# Patient Record
Sex: Male | Born: 1995 | Race: Black or African American | Hispanic: No | Marital: Single | Smoking: Never smoker
Health system: Southern US, Community
[De-identification: ages and names within clinical notes are randomized; demographics above are authoritative.]

---
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
## 2015-02-10 ENCOUNTER — Encounter (HOSPITAL_COMMUNITY): Payer: Self-pay | Admitting: Emergency Medicine

## 2015-02-10 ENCOUNTER — Emergency Department (HOSPITAL_COMMUNITY)
Admission: EM | Admit: 2015-02-10 | Discharge: 2015-02-10 | Disposition: A | Discharge status: Home / Self Care | Payer: Medicaid Other | Attending: Emergency Medicine | Admitting: Emergency Medicine

## 2015-02-10 DIAGNOSIS — W1839XA Other fall on same level, initial encounter: Secondary | ICD-10-CM | POA: Diagnosis not present

## 2015-02-10 DIAGNOSIS — Z23 Encounter for immunization: Secondary | ICD-10-CM | POA: Insufficient documentation

## 2015-02-10 DIAGNOSIS — S0081XA Abrasion of other part of head, initial encounter: Secondary | ICD-10-CM | POA: Diagnosis not present

## 2015-02-10 DIAGNOSIS — S0993XA Unspecified injury of face, initial encounter: Secondary | ICD-10-CM | POA: Diagnosis present

## 2015-02-10 DIAGNOSIS — Y92410 Unspecified street and highway as the place of occurrence of the external cause: Secondary | ICD-10-CM | POA: Diagnosis not present

## 2015-02-10 DIAGNOSIS — Y9302 Activity, running: Secondary | ICD-10-CM | POA: Insufficient documentation

## 2015-02-10 DIAGNOSIS — Y998 Other external cause status: Secondary | ICD-10-CM | POA: Insufficient documentation

## 2015-02-10 MED ORDER — TETANUS-DIPHTH-ACELL PERTUSSIS 5-2.5-18.5 LF-MCG/0.5 IM SUSP
0.5000 mL | Freq: Once | INTRAMUSCULAR | Status: AC
  Administered 2015-02-10: 0.5 mL via INTRAMUSCULAR
  Filled 2015-02-10: qty 0.5

## 2015-02-10 NOTE — Discharge Instructions (Signed)
A CT scan of your face was recommended and offered to rule out a facial fracture, however you opted out of this exam today. If you develop any worsening of your symptoms, severe pain in your face, difficulty with your vision, or moving your eyes, severe headache, nausea, vomiting, dizziness, weakness please return to the emergency room. Continue to wash your abrasion, use Neosporin as needed. Please follow-up with your primary care doctor. Below is information regarding head injuries and what to look out for.   Abrasion An abrasion is a cut or scrape of the skin. Abrasions do not extend through all layers of the skin and most heal within 10 days. It is important to care for your abrasion properly to prevent infection. CAUSES  Most abrasions are caused by falling on, or gliding across, the ground or other surface. When your skin rubs on something, the outer and inner layer of skin rubs off, causing an abrasion. DIAGNOSIS  Your caregiver will be able to diagnose an abrasion during a physical exam.  TREATMENT  Your treatment depends on how large and deep the abrasion is. Generally, your abrasion will be cleaned with water and a mild soap to remove any dirt or debris. An antibiotic ointment may be put over the abrasion to prevent an infection. A bandage (dressing) may be wrapped around the abrasion to keep it from getting dirty.  You may need a tetanus shot if:  You cannot remember when you had your last tetanus shot.  You have never had a tetanus shot.  The injury broke your skin. If you get a tetanus shot, your arm may swell, get red, and feel warm to the touch. This is common and not a problem. If you need a tetanus shot and you choose not to have one, there is a rare chance of getting tetanus. Sickness from tetanus can be serious.  HOME CARE INSTRUCTIONS   If a dressing was applied, change it at least once a day or as directed by your caregiver. If the bandage sticks, soak it off with warm  water.   Wash the area with water and a mild soap to remove all the ointment 2 times a day. Rinse off the soap and pat the area dry with a clean towel.   Reapply any ointment as directed by your caregiver. This will help prevent infection and keep the bandage from sticking. Use gauze over the wound and under the dressing to help keep the bandage from sticking.   Change your dressing right away if it becomes wet or dirty.   Only take over-the-counter or prescription medicines for pain, discomfort, or fever as directed by your caregiver.   Follow up with your caregiver within 24-48 hours for a wound check, or as directed. If you were not given a wound-check appointment, look closely at your abrasion for redness, swelling, or pus. These are signs of infection. SEEK IMMEDIATE MEDICAL CARE IF:   You have increasing pain in the wound.   You have redness, swelling, or tenderness around the wound.   You have pus coming from the wound.   You have a fever or persistent symptoms for more than 2-3 days.  You have a fever and your symptoms suddenly get worse.  You have a bad smell coming from the wound or dressing.  MAKE SURE YOU:   Understand these instructions.  Will watch your condition.  Will get help right away if you are not doing well or get worse. Document Released: 07/01/2005  Document Revised: 09/07/2012 Document Reviewed: 08/25/2011 [facility] Patient Information 2015 [place] [place]. This information is not intended to replace advice given to you by your health care provider. Make sure you discuss any questions you have with your health care provider.   Head Injury You have received a head injury. It does not appear serious at this time. Headaches and vomiting are common following head injury. It should be easy to awaken from sleeping. Sometimes it is necessary for you to stay in the emergency department for a while for observation. Sometimes admission to the hospital may be  needed. After injuries such as yours, most problems occur within the first 24 hours, but side effects may occur up to 7-10 days after the injury. It is important for you to carefully monitor your condition and contact your health care provider or seek immediate medical care if there is a change in your condition. WHAT ARE THE TYPES OF HEAD INJURIES? Head injuries can be as minor as a bump. Some head injuries can be more severe. More severe head injuries include:  A jarring injury to the brain (concussion).  A bruise of the brain (contusion). This mean there is bleeding in the brain that can cause swelling.  A cracked skull (skull fracture).  Bleeding in the brain that collects, clots, and forms a bump (hematoma). WHAT CAUSES A HEAD INJURY? A serious head injury is most likely to happen to someone who is in a car wreck and is not wearing a seat belt. Other causes of major head injuries include bicycle or motorcycle accidents, sports injuries, and falls. HOW ARE HEAD INJURIES DIAGNOSED? A complete history of the event leading to the injury and your current symptoms will be helpful in diagnosing head injuries. Many times, pictures of the brain, such as CT or MRI are needed to see the extent of the injury. Often, an overnight hospital stay is necessary for observation.  WHEN SHOULD I SEEK IMMEDIATE MEDICAL CARE?  You should get help right away if:  You have confusion or drowsiness.  You feel sick to your stomach (nauseous) or have continued, forceful vomiting.  You have dizziness or unsteadiness that is getting worse.  You have severe, continued headaches not relieved by medicine. Only take over-the-counter or prescription medicines for pain, fever, or discomfort as directed by your health care provider.  You do not have normal function of the arms or legs or are unable to walk.  You notice changes in the black spots in the center of the colored part of your eye (pupil).  You have a clear  or bloody fluid coming from your nose or ears.  You have a loss of vision. During the next 24 hours after the injury, you must stay with someone who can watch you for the warning signs. This person should contact local emergency services (911 in the [place] if you have seizures, you become unconscious, or you are unable to wake up. HOW CAN I PREVENT A HEAD INJURY IN THE FUTURE? The most important factor for preventing major head injuries is avoiding motor vehicle accidents. To minimize the potential for damage to your head, it is crucial to wear seat belts while riding in motor vehicles. Wearing helmets while bike riding and playing collision sports (like football) is also helpful. Also, avoiding dangerous activities around the house will further help reduce your risk of head injury.  WHEN CAN I RETURN TO NORMAL ACTIVITIES AND ATHLETICS? You should be reevaluated by your health care provider before  returning to these activities. If you have any of the following symptoms, you should not return to activities or contact sports until 1 week after the symptoms have stopped:  Persistent headache.  Dizziness or vertigo.  Poor attention and concentration.  Confusion.  Memory problems.  Nausea or vomiting.  Fatigue or tire easily.  Irritability.  Intolerant of bright lights or loud noises.  Anxiety or depression.  Disturbed sleep. MAKE SURE YOU:   Understand these instructions.  Will watch your condition.  Will get help right away if you are not doing well or get worse. Document Released: 09/21/2005 Document Revised: 09/26/2013 Document Reviewed: 05/29/2013 [facility] Patient Information 2015 [place], [place]. This information is not intended to replace advice given to you by your health care provider. Make sure you discuss any questions you have with your health care provider.

## 2015-02-10 NOTE — ED Notes (Signed)
Pt states that he was running across the street to go to a party and fell and hurt his face.  Pt states this happened 2 nights ago.  Has been putting neosporin on his face and injury appears as though it is healing well. Unsure of last tetanus date.

## 2015-02-10 NOTE — ED Provider Notes (Signed)
CSN: [id number]     Arrival date & time 02/10/15  1041 History   First MD Initiated Contact with Patient 02/10/15 1049     Chief Complaint  Patient presents with  . Fall  . Facial Injury     (Consider location/radiation/quality/duration/timing/severity/associated sxs/prior Treatment) HPI Patient is a 19 year old male with no past medical history who presents the ER complaining of abrasion on his face with associated swelling. Patient states 2 nights ago he tripped and fell while running, and landed on the lateral right side of his face. He denies loss of consciousness at that time. Since then patient states he has just noticed swelling and an abrasion. He states he has been using Neosporin on the abrasion. Patient denies severe headache, blurred vision, dizziness, weakness, eye pain, nausea, vomiting, fever, neck pain.  History reviewed. No pertinent past medical history. History reviewed. No pertinent past surgical history. History reviewed. No pertinent family history. History  Substance Use Topics  . Smoking status: Never Smoker   . Smokeless tobacco: Not on file  . Alcohol Use: No    Review of Systems  Constitutional: Negative for fever.  HENT: Positive for facial swelling.        Facial injury  Eyes: Negative for visual disturbance.  Respiratory: Negative for shortness of breath.   Cardiovascular: Negative for chest pain.  Gastrointestinal: Negative for nausea, vomiting and abdominal pain.  Genitourinary: Negative for dysuria.  Skin: Negative for rash.  Neurological: Negative for dizziness, syncope, weakness and numbness.  Psychiatric/Behavioral: Negative.       Allergies  Review of patient's allergies indicates no known allergies.  Home Medications   Prior to Admission medications   Not on File   BP 120/82 mmHg  Pulse 54  Temp(Src) 98.1 F (36.7 C) (Oral)  Resp 16  SpO2 100% Physical Exam  Constitutional: He is oriented to person, place, and time. He  appears well-developed and well-nourished. No distress.  HENT:  Head: Normocephalic and atraumatic.    Right Ear: Tympanic membrane normal.  Left Ear: Tympanic membrane normal.  Nose: Nose normal.  Mouth/Throat: Uvula is midline, oropharynx is clear and moist and mucous membranes are normal. No oral lesions. No trismus in the jaw. No dental abscesses or uvula swelling. No oropharyngeal exudate, posterior oropharyngeal edema, posterior oropharyngeal erythema or tonsillar abscesses.  Eyes: Conjunctivae and EOM are normal. Pupils are equal, round, and reactive to light. Right eye exhibits no discharge. Left eye exhibits no discharge. No scleral icterus.  Mild swelling to upper right eyelid and lateral zygoma region.  Neck: Normal range of motion and full passive range of motion without pain. Neck supple. No spinous process tenderness and no muscular tenderness present. No rigidity. No edema, no erythema and normal range of motion present. No Brudzinski's sign and no Kernig's sign noted.  Cardiovascular: Normal rate.   No murmur heard. Pulmonary/Chest: Effort normal. No respiratory distress.  Musculoskeletal: Normal range of motion. He exhibits no edema or tenderness.  Neurological: He is alert and oriented to person, place, and time. He has normal strength. No cranial nerve deficit or sensory deficit. He displays a negative Romberg sign. Coordination normal. GCS eye subscore is 4. GCS verbal subscore is 5. GCS motor subscore is 6.  Patient fully alert, answering questions appropriately in full, clear sentences. Cranial nerves II through XII grossly intact. Motor strength 5 out of 5 in all major muscle groups of upper and lower extremities. Distal sensation intact.   Skin: Skin is warm and dry.  No rash noted. He is not diaphoretic.  Psychiatric: He has a normal mood and affect.  Nursing note and vitals reviewed.   ED Course  Procedures (including critical care time) Labs Review Labs Reviewed -  No data to display  Imaging Review No results found.   EKG Interpretation None      MDM   Final diagnoses:  Facial abrasion, initial encounter    Patient here with injury to face several nights ago, mild abrasion noted on face. This abrasion does not appear to be infected or complicated. There is mild tenderness surrounding, however there is very low concern for facial fractures. Neurologic exam is benign, no concern for concussion or postconcussive symptoms. EOMs intact, no concern for orbital entrapment or orbital injuries. C-spine cleared with Nexus criteria. I did offer patient CT of face to further evaluate mild swelling and tenderness, and to rule out facial fractures. Patient states he does not wish to undergo this test at this time. I discussed risks and benefits of undergoing the test as well as risks and benefits of refusing it. Patient remained adamant that he does not believe this is warranted based on the symptoms he is currently experiencing. I encouraged washing of abrasion with soap and water daily, continue use of Neosporin, as well as using ice for mild swelling of face. Patient afebrile, hemodynamically stable and in no acute distress. Patient to be discharged at this time. Discussed return precautions with patient, strongly encouraged patient to follow up with his primary care provider, which he states is at [facility]. Patient verbalizes understanding and agreement of this plan.  BP 120/82 mmHg  Pulse 54  Temp(Src) 98.1 F (36.7 C) (Oral)  Resp 16  SpO2 100%  Signed,  Ladona MowJoe Janeece Blok, PA-C 2:54 PM     Ladona MowJoe Alroy Portela, PA-C 02/10/15 1454  Lorre NickAnthony Allen, MD 02/11/15 [phone]

## 2016-04-24 ENCOUNTER — Encounter (INDEPENDENT_AMBULATORY_CARE_PROVIDER_SITE_OTHER): Payer: Self-pay | Admitting: *Deleted

## 2016-04-24 ENCOUNTER — Other Ambulatory Visit (INDEPENDENT_AMBULATORY_CARE_PROVIDER_SITE_OTHER): Payer: Self-pay | Admitting: *Deleted

## 2016-04-24 DIAGNOSIS — K921 Melena: Secondary | ICD-10-CM

## 2018-08-22 ENCOUNTER — Telehealth: Payer: Self-pay | Admitting: Family Medicine

## 2018-08-22 NOTE — Telephone Encounter (Signed)
LVM for pt to call the office and reschedule. Appt was originally scheduled for 12/3. ° °I have moved the appt to 12/6 at the same time as the original appt. If this does not work for the patient, please reschedule at their convenience.  ° °Thank you!  °

## 2018-08-22 NOTE — Telephone Encounter (Signed)
° ° ° °  Pt said the appt for 09/09/18 is ok

## 2018-09-06 ENCOUNTER — Ambulatory Visit: Payer: Self-pay | Admitting: Family Medicine

## 2018-09-09 ENCOUNTER — Other Ambulatory Visit: Payer: Self-pay

## 2018-09-09 ENCOUNTER — Ambulatory Visit (INDEPENDENT_AMBULATORY_CARE_PROVIDER_SITE_OTHER): Payer: Self-pay | Admitting: Family Medicine

## 2018-09-09 ENCOUNTER — Encounter: Payer: Self-pay | Admitting: Family Medicine

## 2018-09-09 VITALS — BP 113/76 | HR 73 | Temp 98.5°F | Resp 16 | Ht 63.0 in | Wt 100.0 lb

## 2018-09-09 DIAGNOSIS — F640 Transsexualism: Secondary | ICD-10-CM

## 2018-09-09 DIAGNOSIS — Z79899 Other long term (current) drug therapy: Secondary | ICD-10-CM

## 2018-09-09 DIAGNOSIS — Z789 Other specified health status: Secondary | ICD-10-CM

## 2018-09-09 MED ORDER — ESTRADIOL 2 MG PO TABS: 2.0000 mg | ORAL_TABLET | Freq: Three times a day (TID) | ORAL | 0 refills | Status: DC

## 2018-09-09 MED ORDER — ESTRADIOL 2 MG PO TABS: 2.0000 mg | ORAL_TABLET | Freq: Three times a day (TID) | ORAL | 2 refills | Status: AC

## 2018-09-09 MED ORDER — SPIRONOLACTONE 100 MG PO TABS: 100.0000 mg | ORAL_TABLET | Freq: Two times a day (BID) | ORAL | 2 refills | Status: AC

## 2018-09-09 MED ORDER — SPIRONOLACTONE 100 MG PO TABS: 100.0000 mg | ORAL_TABLET | Freq: Two times a day (BID) | ORAL | 0 refills | Status: DC

## 2018-09-09 MED ORDER — SPIRONOLACTONE 100 MG PO TABS: 100.0000 mg | ORAL_TABLET | Freq: Two times a day (BID) | ORAL | 2 refills | Status: DC

## 2018-09-09 MED ORDER — ESTRADIOL 2 MG PO TABS: 2.0000 mg | ORAL_TABLET | Freq: Three times a day (TID) | ORAL | 2 refills | Status: DC

## 2018-09-09 NOTE — Patient Instructions (Signed)
° ° ° °  If you have lab work done today you will be contacted with your lab results within the next 2 weeks.  If you have not heard from us then please contact us. The fastest way to get your results is to register for My Chart. ° ° °IF you received an x-ray today, you will receive an invoice from [facility]. Please contact [facility] at [phone] with questions or concerns regarding your invoice.  ° °IF you received labwork today, you will receive an invoice from [facility]. Please contact [facility] at [phone] with questions or concerns regarding your invoice.  ° °Our billing staff will not be able to assist you with questions regarding bills from these companies. ° °You will be contacted with the lab results as soon as they are available. The fastest way to get your results is to activate your My Chart account. Instructions are located on the last page of this paperwork. If you have not heard from us regarding the results in 2 weeks, please contact this office. °  ° ° ° °

## 2018-09-09 NOTE — Progress Notes (Signed)
Subjective:    Patient: Noah Garcia  DOB: 1996-06-01; 22 y.o.   MRN: [id number]  Chief Complaint  Patient presents with  . New pt  . Referral    from Dr. Rosario Jacks, Endocrinologist  . Medication Refill    pt request that she would like to go back taking Spironolactone 100mg , "works fine" and it's cheaper than 50mg  twice a day    HPI Started on hormone therapy with Dr. Ruby Cola 09/2015. Takes 1 at 9 am, the other around 2 and then at 9 pm and then takes the spiro at 9 and 9.    Graduated with degree in psychology - thinking about going back to grad school to become therapyst. 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  Off [employer]-Sat and then comes back in at [place] night.  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   PGM passed from breast cancer in late 50s Both sisters of mom passed away from breast cancer.   Going to get back into [employer] and jazz and modern.  Mainly sexually attracted to male but open.   Interested in bottom surgery.  Medical History No past medical history on file. No past surgical history on file. Current Outpatient Medications on File Prior to Visit  Medication Sig Dispense Refill  . estradiol (ESTRACE) 2 MG tablet Take 2 mg by mouth daily.    Marland Kitchen spironolactone (ALDACTONE) 50 MG tablet Take 50 mg by mouth daily.     No current facility-administered medications on file prior to visit.    No Known Allergies Family History  Family history unknown: Yes   Social History   Socioeconomic History  . Marital status: Single    Spouse name: Not on file  . Number of children: Not on file  . Years of education: Not on file  . Highest education level: Not on file  Occupational History  . Not on file  Social Needs  . Financial resource strain: Not on file  . Food insecurity:    Worry: Not on file    Inability: Not on file  . Transportation needs:    Medical: Not on file    Non-medical: Not on file  Tobacco Use  .  Smoking status: Never Smoker  . Smokeless tobacco: Never Used  Substance and Sexual Activity  . Alcohol use: Yes    Alcohol/week: 2.0 standard drinks    Types: 2 Standard drinks or equivalent per week  . Drug use: No  . Sexual activity: Not on file  Lifestyle  . Physical activity:    Days per week: Not on file    Minutes per session: Not on file  . Stress: Not on file  Relationships  . Social connections:    Talks on phone: Not on file    Gets together: Not on file    Attends religious service: Not on file    Active member of club or organization: Not on file    Attends meetings of clubs or organizations: Not on file    Relationship status: Not on file  Other Topics Concern  . Not on file  Social History Narrative  . Not on file   Depression screen [facility] 2/9 09/09/2018  Decreased Interest 0  Down, Depressed, Hopeless 0  PHQ - 2 Score 0    ROS As noted in HPI  Objective:  BP 113/76 (BP Location: Right Arm, Patient Position: Sitting, Cuff Size: Normal)   Pulse 73   Temp  98.5 F (36.9 C) (Oral)   Resp 16   Ht 5\' 3"  (1.6 m)   Wt 100 lb (45.4 kg)   SpO2 98%   BMI 17.71 kg/m  Physical Exam Constitutional:      General: He is not in acute distress.    Appearance: He is well-developed. He is not diaphoretic.  HENT:     Head: Normocephalic and atraumatic.  Eyes:     General: No scleral icterus.    Conjunctiva/sclera: Conjunctivae normal.     Pupils: Pupils are equal, round, and reactive to light.  Neck:     Musculoskeletal: Normal range of motion and neck supple.     Thyroid: No thyromegaly.  Cardiovascular:     Rate and Rhythm: Normal rate and regular rhythm.     Heart sounds: Normal heart sounds.  Pulmonary:     Effort: Pulmonary effort is normal. No respiratory distress.     Breath sounds: Normal breath sounds.  Lymphadenopathy:     Cervical: No cervical adenopathy.  Skin:    General: Skin is warm and dry.  Neurological:     Mental Status: He is alert and  oriented to person, place, and time.  Psychiatric:        Behavior: Behavior normal.     POC TESTING No visits with results within 3 Day(s) from this visit.  Latest known visit with results is:  Orders Only on 10/29/2008  Component Date Value Ref Range Status  . Neutrophils Relative % 10/29/2008 84* 33 - 67 % Final  . Neutro Abs 10/29/2008 7.0  1.5 - 8.0 K/uL Final  . Lymphocytes Relative 10/29/2008 9* 31 - 63 % Final  . Lymphs Abs 10/29/2008 0.8* 1.5 - 7.5 K/uL Final  . Monocytes Relative 10/29/2008 5  3 - 11 % Final  . Monocytes Absolute 10/29/2008 0.4  0.2 - 1.2 K/uL Final  . Eosinophils Relative 10/29/2008 2  0 - 5 % Final  . Eosinophils Absolute 10/29/2008 0.1  0.0 - 1.2 K/uL Final  . Basophils Relative 10/29/2008 1  0 - 1 % Final  . Basophils Absolute 10/29/2008 0.0  0.0 - 0.1 K/uL Final     Assessment & Plan:   1. Encounter for long-term current use of high risk medication    Patient will continue on current chronic medications other than changes noted above, so ok to refill when needed.   See after visit summary for patient specific instructions.  Orders Placed This Encounter  Procedures  . Comprehensive metabolic panel    Standing Status:   Future    Standing Expiration Date:   09/10/2019  . CBC with Differential/Platelet    Standing Status:   Future    Standing Expiration Date:   09/10/2019  . TestT+TestF+SHBG    Standing Status:   Future    Standing Expiration Date:   09/10/2019  . Estradiol    Standing Status:   Future    Standing Expiration Date:   09/10/2019    Meds ordered this encounter  Medications  . DISCONTD: spironolactone (ALDACTONE) 100 MG tablet    Sig: Take 1 tablet (100 mg total) by mouth 2 (two) times daily.    Dispense:  180 tablet    Refill:  0  . DISCONTD: estradiol (ESTRACE) 2 MG tablet    Sig: Take 1 tablet (2 mg total) by mouth 3 (three) times daily.    Dispense:  270 tablet    Refill:  0  . DISCONTD: estradiol (ESTRACE) 2  MG tablet     Sig: Take 1 tablet (2 mg total) by mouth 3 (three) times daily.    Dispense:  90 tablet    Refill:  2    Pt prefers 30d with refills - please d/c prior 90d rx  . DISCONTD: spironolactone (ALDACTONE) 100 MG tablet    Sig: Take 1 tablet (100 mg total) by mouth 2 (two) times daily.    Dispense:  60 tablet    Refill:  2    Pt prefers 30d with refills - please d/c prior 90d rx  . estradiol (ESTRACE) 2 MG tablet    Sig: Take 1 tablet (2 mg total) by mouth 3 (three) times daily.    Dispense:  90 tablet    Refill:  2  . spironolactone (ALDACTONE) 100 MG tablet    Sig: Take 1 tablet (100 mg total) by mouth 2 (two) times daily.    Dispense:  60 tablet    Refill:  2    Patient verbalized to me that they understand the following: diagnosis, what is being done for them, what to expect and what should be done at home.  Their questions have been answered. They understand that I am unable to predict every possible medication interaction or adverse outcome and that if any unexpected symptoms arise, they should contact us and their pharmacist, as well as never hesitate to [facility] or ER if they think it might be warranted.    Norberto SorensonEva , MD, MPH [facility]  [facility] [address] [place], [place]  [id number] [phone] Office phone  [phone] Office fax  09/09/18 4:30 PM

## 2018-10-03 DIAGNOSIS — Z789 Other specified health status: Secondary | ICD-10-CM | POA: Insufficient documentation

## 2018-10-03 DIAGNOSIS — F640 Transsexualism: Secondary | ICD-10-CM | POA: Insufficient documentation

## 2018-12-14 ENCOUNTER — Telehealth: Payer: Self-pay | Admitting: Family Medicine

## 2018-12-14 NOTE — Telephone Encounter (Signed)
Copied from CRM [id number]. Topic: Medical Record Request - Patient ROI Request >> Dec 14, 2018  2:30 PM Maia Petties wrote: Requestor Name/Agency: self Call Back #: [phone] Information Requested: pt is wanting to come into the office to pick up copies of OV and labs from Dr. Clelia Croft. Pt has only seen Dr. Clelia Croft once but said they had previous medical records transferred to Dr. Clelia Croft too. Pt needs copies of those records as well and will come into the office Thursday 12/22/2018 to pick up and complete MR request form.   Can someone check for these records to have prepared for the pt? Please call pt.   Route to [employer] for [employer]. For all other clinics, route to the clinic's PEC Pool.

## 2018-12-15 NOTE — Telephone Encounter (Signed)
I called pt and left a VM. I printed off his ov notes from 09/09/18, their labs were active-so no results, they have not had labs done yet. I informed pt that I not able to give another practice's medical info concerning pt to them.

## 2019-04-12 ENCOUNTER — Encounter (HOSPITAL_COMMUNITY): Payer: Self-pay | Admitting: *Deleted

## 2019-04-12 ENCOUNTER — Emergency Department (HOSPITAL_COMMUNITY): Payer: Self-pay

## 2019-04-12 ENCOUNTER — Other Ambulatory Visit: Payer: Self-pay

## 2019-04-12 ENCOUNTER — Emergency Department (HOSPITAL_COMMUNITY)
Admission: EM | Admit: 2019-04-12 | Discharge: 2019-04-12 | Disposition: A | Discharge status: Home / Self Care | Payer: Self-pay | Attending: Emergency Medicine | Admitting: Emergency Medicine

## 2019-04-12 DIAGNOSIS — Z79899 Other long term (current) drug therapy: Secondary | ICD-10-CM | POA: Insufficient documentation

## 2019-04-12 DIAGNOSIS — R1084 Generalized abdominal pain: Secondary | ICD-10-CM | POA: Insufficient documentation

## 2019-04-12 DIAGNOSIS — G8929 Other chronic pain: Secondary | ICD-10-CM | POA: Insufficient documentation

## 2019-04-12 DIAGNOSIS — M546 Pain in thoracic spine: Secondary | ICD-10-CM | POA: Insufficient documentation

## 2019-04-12 LAB — CBC
HCT: 48.6 % (ref 39.0–52.0)
Hemoglobin: 17.2 g/dL — ABNORMAL HIGH (ref 13.0–17.0)
MCH: 26 pg (ref 26.0–34.0)
MCHC: 35.4 g/dL (ref 30.0–36.0)
MCV: 73.5 fL — ABNORMAL LOW (ref 80.0–100.0)
Platelets: 351 10*3/uL (ref 150–400)
RBC: 6.61 MIL/uL — ABNORMAL HIGH (ref 4.22–5.81)
RDW: 12.7 % (ref 11.5–15.5)
WBC: 5 10*3/uL (ref 4.0–10.5)
nRBC: 0 % (ref 0.0–0.2)

## 2019-04-12 LAB — URINALYSIS, ROUTINE W REFLEX MICROSCOPIC
Bilirubin Urine: NEGATIVE
Glucose, UA: NEGATIVE mg/dL
Hgb urine dipstick: NEGATIVE
Ketones, ur: 20 mg/dL — AB
Leukocytes,Ua: NEGATIVE
Nitrite: NEGATIVE
Protein, ur: NEGATIVE mg/dL
Specific Gravity, Urine: 1.025 (ref 1.005–1.030)
pH: 6 (ref 5.0–8.0)

## 2019-04-12 LAB — COMPREHENSIVE METABOLIC PANEL
ALT: 14 U/L (ref 0–44)
AST: 17 U/L (ref 15–41)
Albumin: 4.7 g/dL (ref 3.5–5.0)
Alkaline Phosphatase: 59 U/L (ref 38–126)
Anion gap: 9 (ref 5–15)
BUN: 17 mg/dL (ref 6–20)
CO2: 24 mmol/L (ref 22–32)
Calcium: 9.6 mg/dL (ref 8.9–10.3)
Chloride: 101 mmol/L (ref 98–111)
Creatinine, Ser: 1.01 mg/dL (ref 0.61–1.24)
GFR calc Af Amer: >60 mL/min (ref >60)
GFR calc non Af Amer: >60 mL/min (ref >60)
Glucose, Bld: 92 mg/dL (ref 70–99)
Potassium: 3.9 mmol/L (ref 3.5–5.1)
Sodium: 134 mmol/L — ABNORMAL LOW (ref 135–145)
Total Bilirubin: 1.1 mg/dL (ref 0.3–1.2)
Total Protein: 8.8 g/dL — ABNORMAL HIGH (ref 6.5–8.1)

## 2019-04-12 LAB — LIPASE, BLOOD: Lipase: 24 U/L (ref 11–51)

## 2019-04-12 LAB — PREGNANCY, URINE: Preg Test, Ur: NEGATIVE

## 2019-04-12 MED ORDER — SODIUM CHLORIDE 0.9% FLUSH: 3.0000 mL | Freq: Once | INTRAVENOUS | Status: DC

## 2019-04-12 MED ORDER — LIDOCAINE 5 % EX PTCH: 1.0000 | MEDICATED_PATCH | CUTANEOUS | 0 refills | Status: DC

## 2019-04-12 NOTE — ED Notes (Signed)
Urine culture sent to lab with UA sample 

## 2019-04-12 NOTE — ED Provider Notes (Signed)
[facility] DEPT Provider Note   CSN: [id number] Arrival date & time: 04/12/19  0849  History   Chief Complaint Chief Complaint  Patient presents with   Abdominal Pain   Back Pain   HPI Noah Garcia is a 23 y.o. male to male transgender who presents for evaluation of 2 complaints.  Patient states they have had midline thoracic back pain times years.  Patient states this is intermittent weekly occurs after she works long hours.  Patient states she has increased her hours due to going back after her COVID restrictions were up this is when her pain began.  Patient states she took Tylenol yesterday which resolved her pain.  She has no current back pain.  She denies any injuries or trauma.  Denies fever, chills, abdominal pain radiating into back, IV drug use, bowel or bladder incontinence, saddle paresthesias, decreased range of motion, numbness or tingling in the extremities.  She states pain feels similar to previous back pain.  Patient states that she has also had diffuse abdominal cramping over the last 3 days.  Patient states she normally has 3 bowel movements a day however over the last 2 days she did not have a bowel movement.  Patient states she was also able to have a bowel movement this morning that melena or hematochezia which resolved majority of her symptoms.  She has no current abdominal pain.  Denies cough, nausea, vomiting, hematemesis, dysuria, diarrhea, suspicious food intake.  Has not taken anything for symptoms.  She is tolerating p.o. intake at home without difficulty.  History obtained from patient and past medical records.  No interpreter is used.     HPI  History reviewed. No pertinent past medical history.  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 No pertinent surgical history.      Home Medications    Prior to Admission medications   Medication  Sig Start Date End Date Taking? Authorizing Provider  estradiol (ESTRACE) 2 MG tablet Take 1 tablet (2 mg total) by mouth 3 (three) times daily. 09/09/18  Yes Shawnee Knapp, MD  spironolactone (ALDACTONE) 100 MG tablet Take 1 tablet (100 mg total) by mouth 2 (two) times daily. 09/09/18  Yes Shawnee Knapp, MD  lidocaine (LIDODERM) 5 % Place 1 patch onto the skin daily. Remove & Discard patch within 12 hours or as directed by MD 04/12/19   Toran Murch A, PA-C    Family History Family History  Family history unknown: Yes    Social History Social History   Tobacco Use   Smoking status: Never Smoker   Smokeless tobacco: Never Used  Substance Use Topics   Alcohol use: Yes    Alcohol/week: 2.0 standard drinks    Types: 2 Standard drinks or equivalent per week   Drug use: No     Allergies   Patient has no known allergies.   Review of Systems Review of Systems  Constitutional: Negative.   HENT: Negative.   Eyes: Negative.   Respiratory: Negative.   Cardiovascular: Negative.   Gastrointestinal: Positive for abdominal pain. Negative for abdominal distention, anal bleeding, blood in stool, constipation, diarrhea, nausea, rectal pain and vomiting.  Genitourinary: Negative.   Musculoskeletal: Positive for back pain. Negative for arthralgias, gait problem, joint swelling, myalgias, neck pain and neck stiffness.  Skin: Negative.   Neurological: Negative.   All other systems reviewed and are negative.  Physical Exam  Updated Vital Signs BP (!) 123/95 (BP Location: Right Arm)    Pulse (!) 104    Temp 99 F (37.2 C) (Oral)    Resp 18    SpO2 96%   Physical Exam Vitals signs and nursing note reviewed.  Constitutional:      General: He is not in acute distress.    Appearance: He is not ill-appearing, toxic-appearing or diaphoretic.  HENT:     Head: Normocephalic and atraumatic.     Jaw: There is normal jaw occlusion.     Nose:     Comments: Clear rhinorrhea and congestion to  bilateral nares.  No sinus tenderness.    Mouth/Throat:     Mouth: Mucous membranes are moist.     Pharynx: Oropharynx is clear.     Comments: Posterior oropharynx clear.  Mucous membranes moist.  Tonsils without erythema or exudate.  Uvula midline without deviation.  No evidence of PTA or RPA.  No drooling, dysphasia or trismus.  Phonation normal. Neck:     Musculoskeletal: Full passive range of motion without pain and normal range of motion.     Trachea: Trachea and phonation normal.     Comments: No Neck stiffness or neck rigidity.  No cervical lymphadenopathy. Cardiovascular:     Rate and Rhythm: Normal rate.     Heart sounds: Normal heart sounds.     Comments: No murmurs rubs or gallops. Pulmonary:     Effort: Pulmonary effort is normal.     Comments: Clear to auscultation bilaterally without wheeze, rhonchi or rales.  No accessory muscle usage.  Able speak in full sentences. Abdominal:     General: Bowel sounds are normal.     Palpations: Abdomen is soft.     Tenderness: There is no abdominal tenderness. There is no left CVA tenderness, guarding or rebound. Negative signs include Murphy's sign.     Hernia: No hernia is present.     Comments: Soft, nontender without rebound or guarding.  No CVA tenderness.  Musculoskeletal:     Cervical back: Normal.     Thoracic back: He exhibits tenderness. He exhibits normal range of motion, no bony tenderness, no swelling, no edema, no deformity, no laceration, no pain, no spasm and normal pulse.     Lumbar back: Normal.     Comments: Moves all 4 extremities without difficulty.  Lower extremities without edema, erythema or warmth. Full range of motion of the T-spine and L-spine with flexion, hyperextension, and lateral flexion. No midline tenderness or stepoffs. No tenderness to palpation of the spinous processes of the T-spine or L-spine.  Tenderness palpation to bilateral paraspinal muscles to thoracic spine.  Able to reproduce her pain.  No  overlying skin changes. No tenderness to palpation of the paraspinous muscles of the L-spine. Negative straight leg raise.  Skin:    Comments: Brisk capillary refill.  No rashes or lesions.  Neurological:     Mental Status: He is alert.     Comments: Speech is clear and goal oriented, follows commands Normal 5/5 strength in upper and lower extremities bilaterally including dorsiflexion and plantar flexion, strong and equal grip strength Sensation normal to light and sharp touch Moves extremities without ataxia, coordination intact Normal gait Normal balance No Clonus     ED Treatments / Results  Labs (all labs ordered are listed, but only abnormal results are displayed) Labs Reviewed  COMPREHENSIVE METABOLIC PANEL - Abnormal; Notable for the following components:      Result Value  Sodium 134 (*)    Total Protein 8.8 (*)    All other components within normal limits  CBC - Abnormal; Notable for the following components:   RBC 6.61 (*)    Hemoglobin 17.2 (*)    MCV 73.5 (*)    All other components within normal limits  URINALYSIS, ROUTINE W REFLEX MICROSCOPIC - Abnormal; Notable for the following components:   APPearance HAZY (*)    Ketones, ur 20 (*)    All other components within normal limits  LIPASE, BLOOD  PREGNANCY, URINE    EKG None  Radiology Dg Thoracic Spine 2 View  Result Date: 04/12/2019 CLINICAL DATA:  Upper back pain. EXAM: THORACIC SPINE 2 VIEWS COMPARISON:  None. FINDINGS: There is no evidence of thoracic spine fracture. Alignment is normal. No other significant bone abnormalities are identified. IMPRESSION: Negative. Electronically Signed   By: Lupita RaiderJames  Green Jr M.D.   On: 04/12/2019 11:57   Dg Abdomen 1 View  Result Date: 04/12/2019 CLINICAL DATA:  Epigastric abdominal pain. EXAM: ABDOMEN - 1 VIEW COMPARISON:  None. FINDINGS: The bowel gas pattern is normal. No radio-opaque calculi or other significant radiographic abnormality are seen. IMPRESSION: No  evidence of bowel obstruction or ileus. Electronically Signed   By: Lupita RaiderJames  Green Jr M.D.   On: 04/12/2019 11:58    Procedures Procedures (including critical care time)  Medications Ordered in ED Medications  sodium chloride flush (NS) 0.9 % injection 3 mL (has no administration in time range)     Initial Impression / Assessment and Plan / ED Course  I have reviewed the triage vital signs and the nursing notes.  Pertinent labs & imaging results that were available during my care of the patient were reviewed by me and considered in my medical decision making (see chart for details).   [age] male to male transgender who appears otherwise well presents for evaluation of back pain and abdominal pain.  Febrile, nonseptic, non-ill-appearing.  History of chronic thoracic back pain worse when stands for long periods of time.  Has recently had extended periods of standing at work.  Took Tylenol yesterday which resolved pain.  Has no current pain on evaluation.  Normal musculoskeletal exam.  Neurovascularly intact.  Able to reproduce pain to palpation to paraspinal muscles to thoracic spine.  No midline spinal step-offs or crepitus.  No new injuries or trauma.  No red flags for back pain.  Likely acute on chronic pain.  Without tachypnea or hypoxia.  No cough, hemoptysis, shortness of breath to suggest atypical PE, dissection or ACS.  Discussed taking Tylenol and ibuprofen for symptoms.  Also had diffuse abdominal pain which she described as cramping this morning.  Had constipation over the last 2 days had a bowel movement this morning without melena or hematochezia which resolved the pain.  Tolerating p.o. intake without difficulty.  Abdomen soft, nontender without rebound or guarding.  Nonsurgical abdomen.  Labs obtained from triage.  Labs and imaging personally reviewed: CBC without leukocytosis Metabolic panel with mild hyponatremia at 134, additional electrolyte, renal or liver abnormality Lipase  24 Urinalysis negative for infection Plain film abdomen without evidence of constipation, ileus Plain film thoracic without acute findings.  Patient is nontoxic, nonseptic appearing, in no apparent distress.   Labs, imaging and vitals reviewed.  Patient does not meet the SIRS or Sepsis criteria.  On repeat exam patient does not have a surgical abdomin and there are no peritoneal signs.  No indication of appendicitis, bowel obstruction, bowel perforation,  cholecystitis, diverticulitis.  Patient discharged home with symptomatic treatment and given strict instructions for follow-up with their primary care physician. No neurological deficits and normal neuro exam.  Patient can walk but states is painful.  No loss of bowel or bladder control.  No concern for cauda equina, discitis, osteomyelitis, transverse myelitis, bowel perforation, abscess, PE or dissection. no fever, night sweats, weight loss, h/o cancer, IVDU.  RICE protocol and pain medicine indicated and discussed with patient. Has not had pain her entire ED stay.  Tolerating p.o. intake and ambulating without difficulty.  The patient has been appropriately medically screened and/or stabilized in the ED. I have low suspicion for any other emergent medical condition which would require further screening, evaluation or treatment in the ED or require inpatient management.  Patient is hemodynamically stable and in no acute distress.  Patient able to ambulate in department prior to ED.  Evaluation does not show acute pathology that would require ongoing or additional emergent interventions while in the emergency department or further inpatient treatment.  I have discussed the diagnosis with the patient and answered all questions.  Patient has no further complaints prior to discharge.  Patient is comfortable with plan discussed in room and is stable for discharge at this time.  I have discussed strict return precautions for returning to the emergency  department.  Patient was encouraged to follow-up with PCP/specialist refer to at discharge.      Final Clinical Impressions(s) / ED Diagnoses   Final diagnoses:  Chronic midline thoracic back pain  Generalized abdominal pain    ED Discharge Orders         Ordered    lidocaine (LIDODERM) 5 %  Every 24 hours     04/12/19 1238           Madia Carvell A, PA-C 04/12/19 1245    Mancel BaleWentz, Elliott, MD 04/12/19 1807

## 2019-04-12 NOTE — ED Triage Notes (Signed)
Pt complains of upper abdominal pain x 2 days and mid back pain. Pt states her mid back pain feels similar to what she's had in the past. Pt states she did not have bowel until this morning, which she states was small. Pt denies urinary symptoms. Pt states symptoms were worse last night.

## 2019-04-12 NOTE — Discharge Instructions (Signed)
Follow-up with PCP for evaluation of her chronic back pain.  Use lidocaine patches may place for 12 hours, remove for 12 hours and taken place additional 1.

## 2020-05-20 IMAGING — CR ABDOMEN - 1 VIEW
1 series · 1 of 1 positions shown · non-contrast
Comparison: None.

CLINICAL DATA: Epigastric abdominal pain.

EXAM:
ABDOMEN - 1 VIEW

[t abdomen supine]
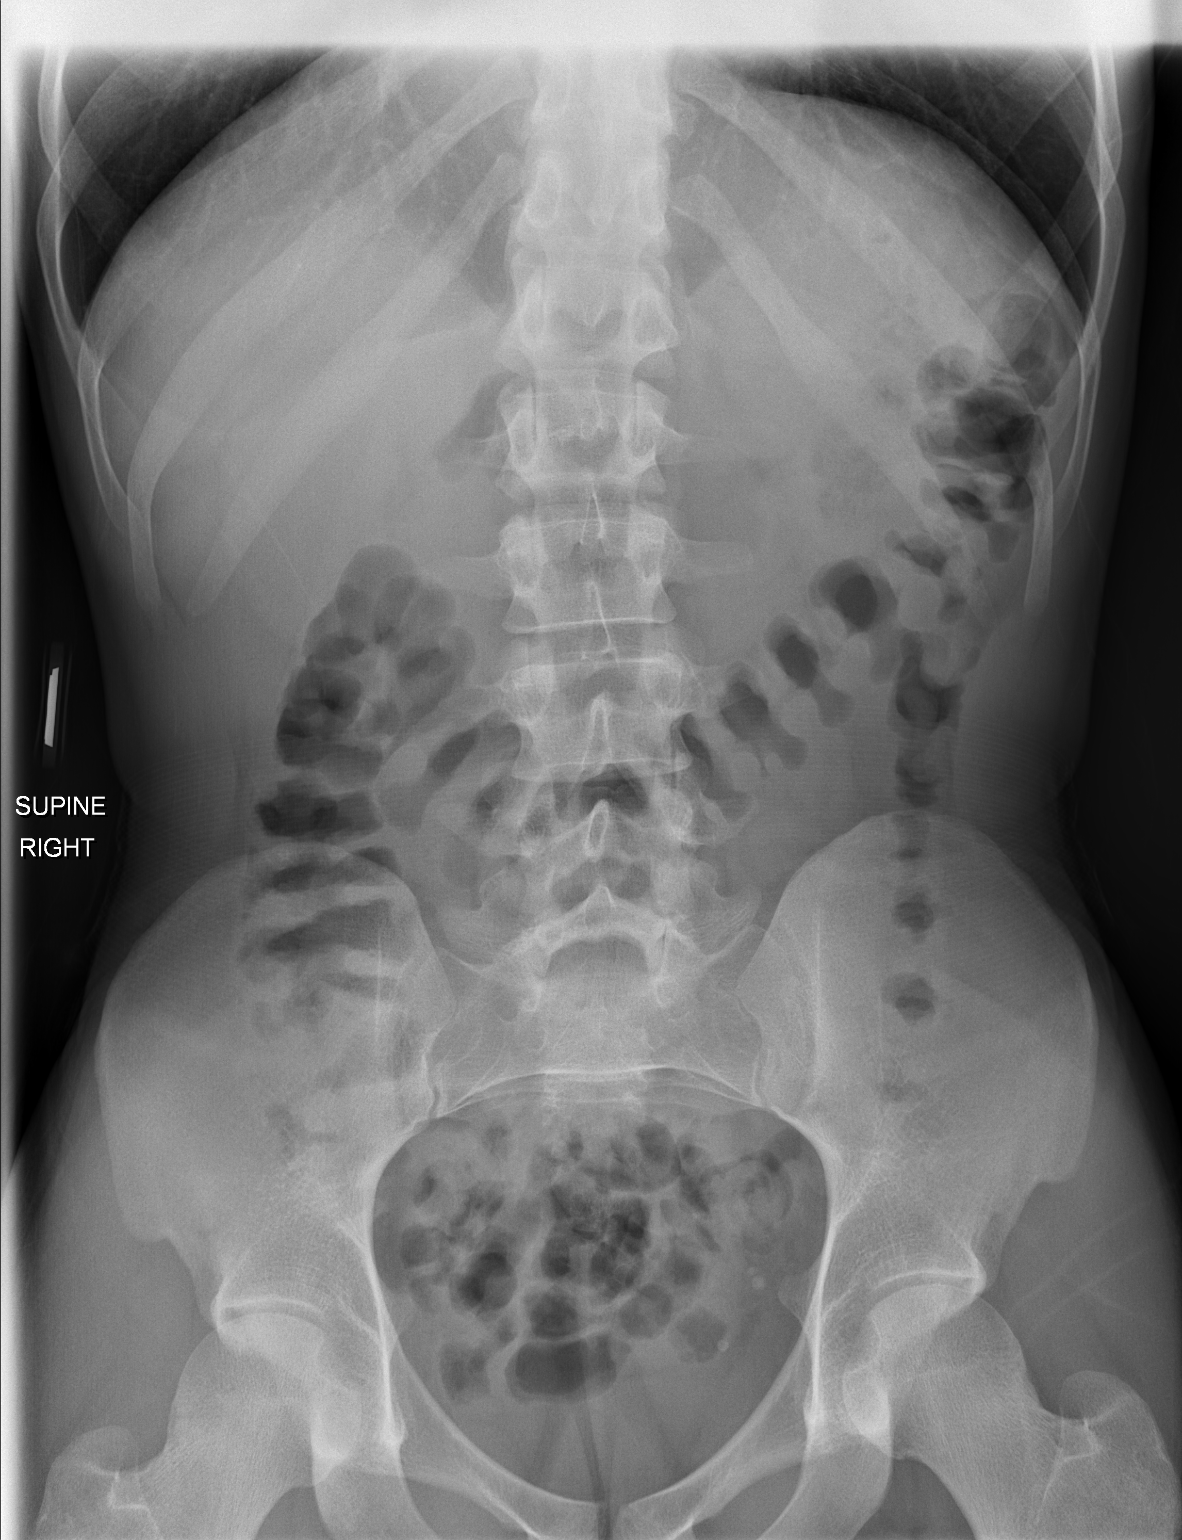

[1 of 1 positions shown; findings below may reference images not displayed]

FINDINGS: The bowel gas pattern is normal. No radio-opaque calculi or other
significant radiographic abnormality are seen.
IMPRESSION: No evidence of bowel obstruction or ileus.

## 2020-05-20 IMAGING — CR THORACIC SPINE 2 VIEWS
3 series · 3 of 3 positions shown · non-contrast
Comparison: None.

CLINICAL DATA: Upper back pain.

EXAM:
THORACIC SPINE 2 VIEWS

[t thoracic spine ap]
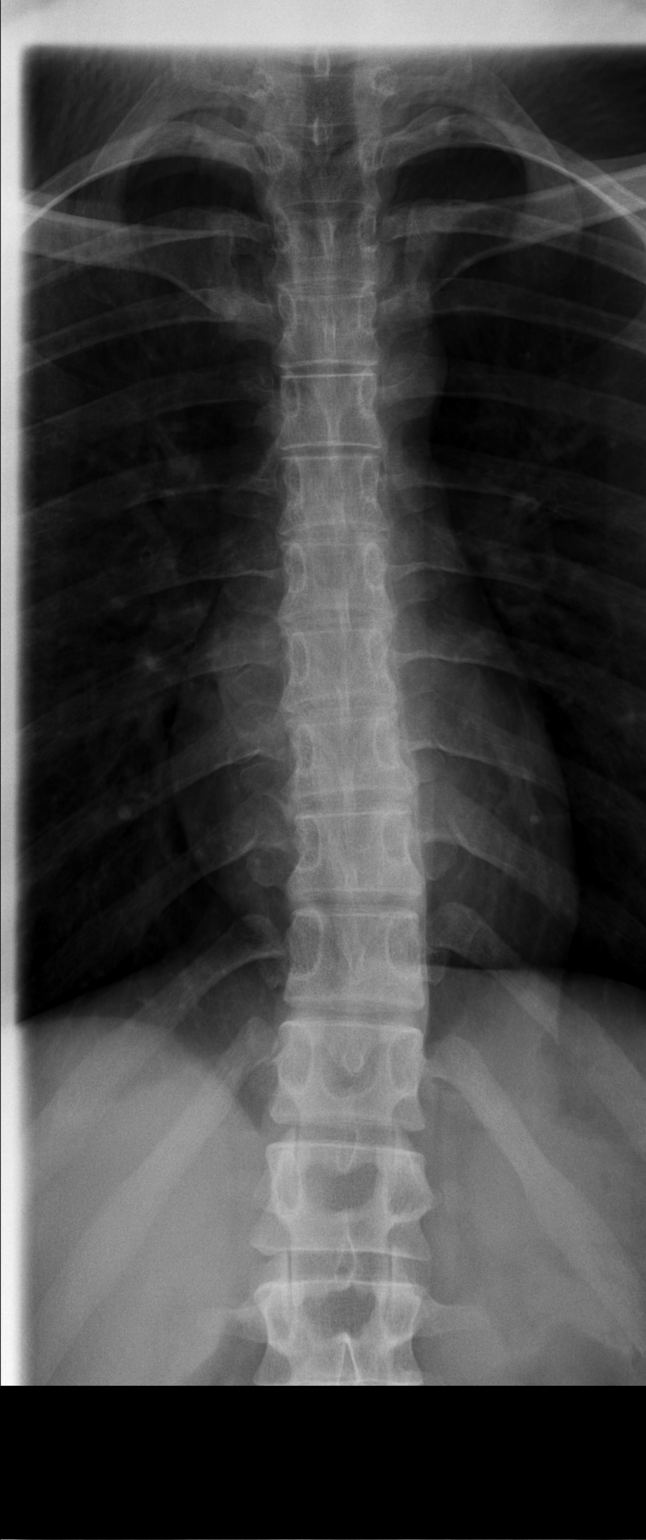

[t thoracic spine lat]
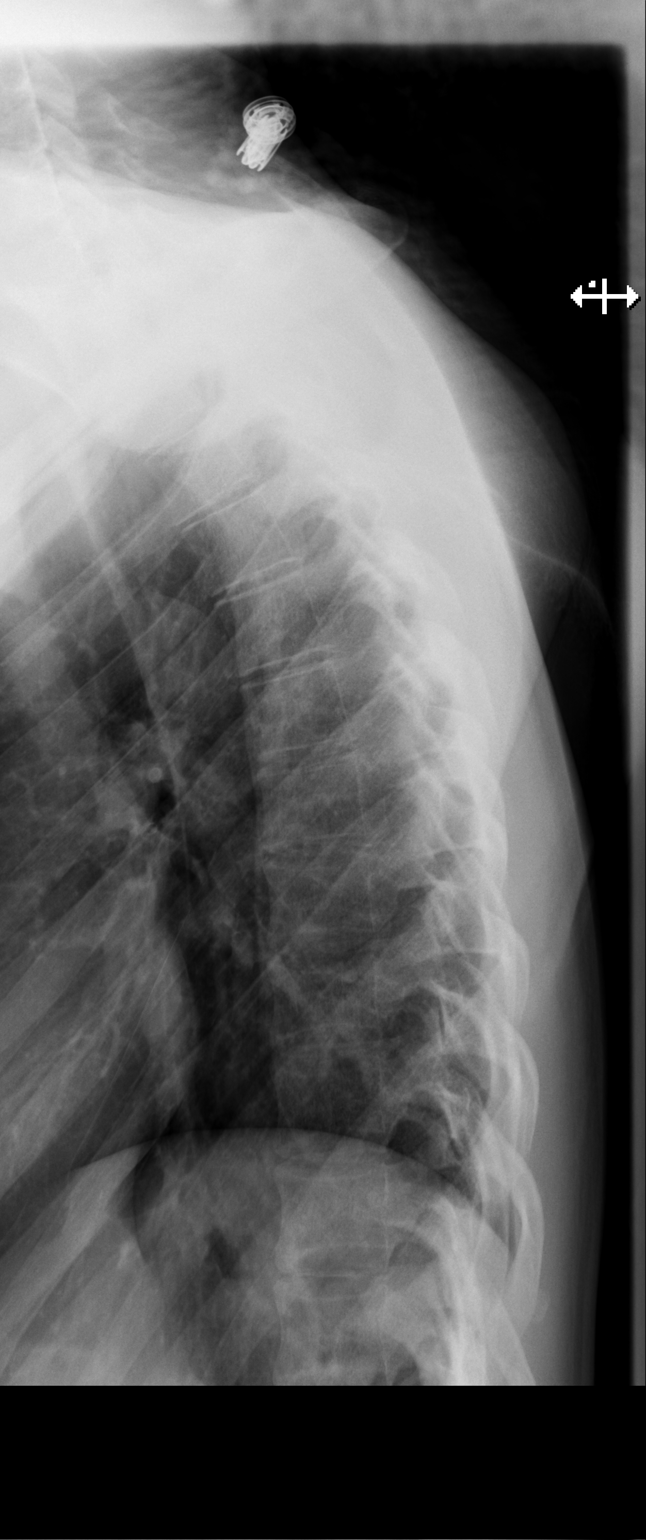

[t thoracic swimmers]
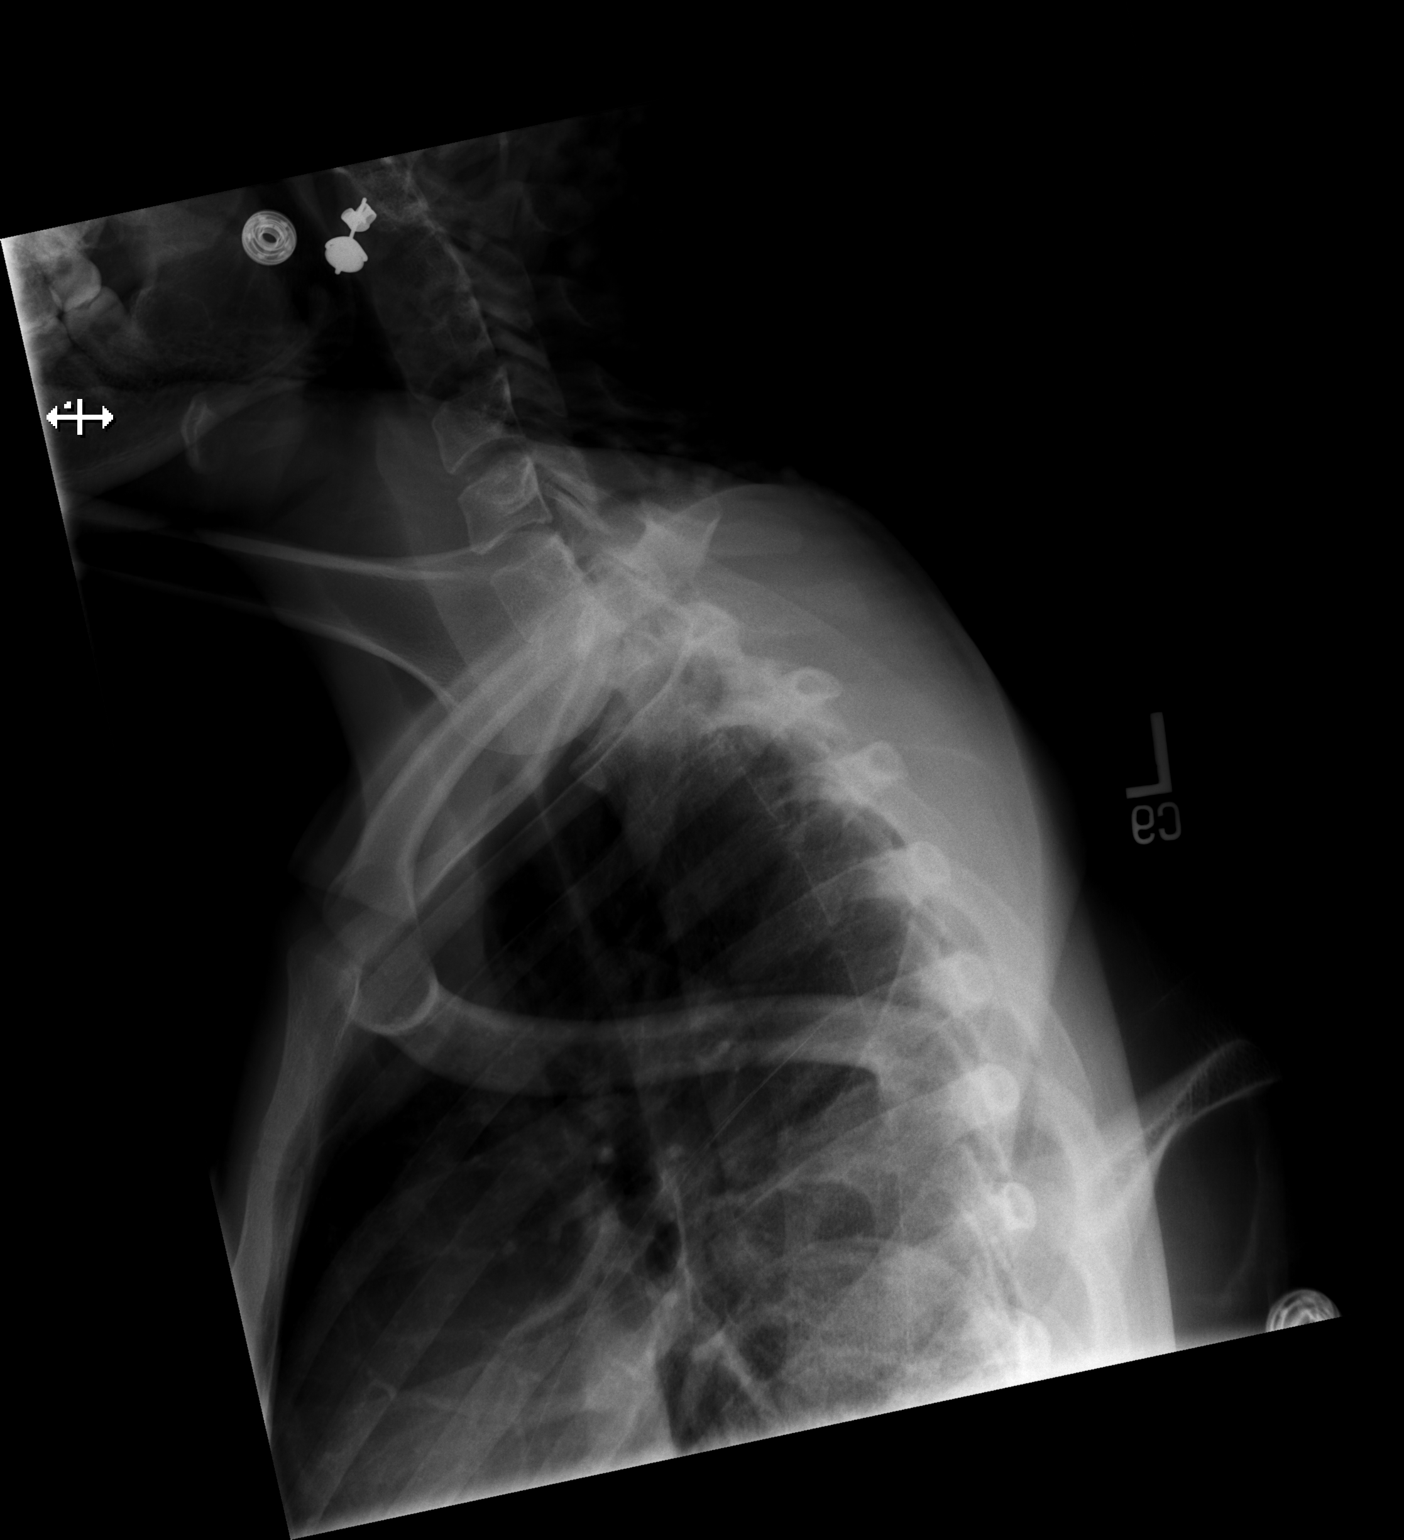

[3 of 3 positions shown; findings below may reference images not displayed]

FINDINGS: There is no evidence of thoracic spine fracture. Alignment is
normal. No other significant bone abnormalities are identified.
IMPRESSION: Negative.

## 2020-08-04 DIAGNOSIS — R1012 Left upper quadrant pain: Secondary | ICD-10-CM | POA: Insufficient documentation

## 2020-08-05 ENCOUNTER — Encounter (HOSPITAL_COMMUNITY): Payer: Self-pay | Admitting: Emergency Medicine

## 2020-08-05 ENCOUNTER — Emergency Department (HOSPITAL_COMMUNITY)
Admission: EM | Admit: 2020-08-05 | Discharge: 2020-08-05 | Disposition: A | Discharge status: Home / Self Care | Payer: Medicaid Other | Attending: Emergency Medicine | Admitting: Emergency Medicine

## 2020-08-05 DIAGNOSIS — R1012 Left upper quadrant pain: Secondary | ICD-10-CM

## 2020-08-05 LAB — RPR: RPR Ser Ql: NONREACTIVE

## 2020-08-05 LAB — COMPREHENSIVE METABOLIC PANEL
ALT: 16 U/L (ref 0–44)
AST: 16 U/L (ref 15–41)
Albumin: 5.4 g/dL — ABNORMAL HIGH (ref 3.5–5.0)
Alkaline Phosphatase: 57 U/L (ref 38–126)
Anion gap: 15 (ref 5–15)
BUN: 8 mg/dL (ref 6–20)
CO2: 24 mmol/L (ref 22–32)
Calcium: 10.2 mg/dL (ref 8.9–10.3)
Chloride: 97 mmol/L — ABNORMAL LOW (ref 98–111)
Creatinine, Ser: 0.7 mg/dL (ref 0.61–1.24)
GFR, Estimated: >60 mL/min (ref >60)
Glucose, Bld: 98 mg/dL (ref 70–99)
Potassium: 3.4 mmol/L — ABNORMAL LOW (ref 3.5–5.1)
Sodium: 136 mmol/L (ref 135–145)
Total Bilirubin: 1.6 mg/dL — ABNORMAL HIGH (ref 0.3–1.2)
Total Protein: 9.1 g/dL — ABNORMAL HIGH (ref 6.5–8.1)

## 2020-08-05 LAB — CBC
HCT: 40.9 % (ref 39.0–52.0)
Hemoglobin: 14.7 g/dL (ref 13.0–17.0)
MCH: 26.9 pg (ref 26.0–34.0)
MCHC: 35.9 g/dL (ref 30.0–36.0)
MCV: 74.9 fL — ABNORMAL LOW (ref 80.0–100.0)
Platelets: 433 10*3/uL — ABNORMAL HIGH (ref 150–400)
RBC: 5.46 MIL/uL (ref 4.22–5.81)
RDW: 12.8 % (ref 11.5–15.5)
WBC: 6.3 10*3/uL (ref 4.0–10.5)
nRBC: 0 % (ref 0.0–0.2)

## 2020-08-05 LAB — URINALYSIS, ROUTINE W REFLEX MICROSCOPIC
Bilirubin Urine: NEGATIVE
Glucose, UA: NEGATIVE mg/dL
Hgb urine dipstick: NEGATIVE
Ketones, ur: NEGATIVE mg/dL
Leukocytes,Ua: NEGATIVE
Nitrite: NEGATIVE
Protein, ur: NEGATIVE mg/dL
Specific Gravity, Urine: 1.004 — ABNORMAL LOW (ref 1.005–1.030)
pH: 6 (ref 5.0–8.0)

## 2020-08-05 LAB — HIV ANTIBODY (ROUTINE TESTING W REFLEX): HIV Screen 4th Generation wRfx: NONREACTIVE

## 2020-08-05 LAB — LIPASE, BLOOD: Lipase: 28 U/L (ref 11–51)

## 2020-08-05 MED ORDER — SUCRALFATE 1 GM/10ML PO SUSP
1.0000 g | Freq: Once | ORAL | Status: AC
  Administered 2020-08-05: 1 g via ORAL
  Filled 2020-08-05: qty 10

## 2020-08-05 MED ORDER — PANTOPRAZOLE SODIUM 40 MG IV SOLR
40.0000 mg | Freq: Once | INTRAVENOUS | Status: AC
  Administered 2020-08-05: 40 mg via INTRAVENOUS
  Filled 2020-08-05: qty 40

## 2020-08-05 MED ORDER — OMEPRAZOLE 20 MG PO CPDR: DELAYED_RELEASE_CAPSULE | ORAL | 0 refills | Status: DC

## 2020-08-05 MED ORDER — SUCRALFATE 1 G PO TABS: 1.0000 g | ORAL_TABLET | Freq: Three times a day (TID) | ORAL | 0 refills | Status: DC

## 2020-08-05 NOTE — ED Provider Notes (Signed)
[facility]-EMERGENCY DEPT Provider Note: Lowella Dell, MD, FACEP  CSN: [id number] MRN: [id number] ARRIVAL: 08/04/20 at 2352 ROOM: WA18/[facility]18   CHIEF COMPLAINT  Abdominal Pain   HISTORY OF PRESENT ILLNESS  08/05/20 12:48 AM Noah Garcia is a 24 y.o. adult who identifies as male with left upper quadrant abdominal pain that began earlier this week.  It has been intermittent and was "really bad" yesterday, an 8 out of 10.  The patient states the pain radiates to his back.  The patient denies any associated nausea, vomiting, diarrhea or hematuria.  The patient would like to be screened for STDs though there is no dysuria or other genital complaints.    History reviewed. No pertinent past medical history.  History reviewed. No pertinent surgical history.  Family History  Family history unknown: Yes    Social History   Tobacco Use  . Smoking status: Never Smoker  . Smokeless tobacco: Never Used  Substance Use Topics  . Alcohol use: Yes    Alcohol/week: 2.0 standard drinks    Types: 2 Standard drinks or equivalent per week  . Drug use: No    Prior to Admission medications   Medication Sig Start Date End Date Taking? Authorizing Provider  estradiol (ESTRACE) 2 MG tablet Take 1 tablet (2 mg total) by mouth 3 (three) times daily. 09/09/18   Sherren Mocha, MD  lidocaine (LIDODERM) 5 % Place 1 patch onto the skin daily. Remove & Discard patch within 12 hours or as directed by MD 04/12/19   Henderly, Britni A, PA-C  omeprazole (PRILOSEC) 20 MG capsule Take 1 tablet every morning at least 30 minutes before first dose of Carafate. 08/05/20   Gwenevere Goga, MD  spironolactone (ALDACTONE) 100 MG tablet Take 1 tablet (100 mg total) by mouth 2 (two) times daily. 09/09/18   Sherren Mocha, MD  sucralfate (CARAFATE) 1 g tablet Take 1 tablet (1 g total) by mouth 4 (four) times daily -  with meals and at bedtime. 08/05/20   Brigette Hopfer, Jonny Ruiz, MD    Allergies Patient has no known allergies.   REVIEW OF  SYSTEMS  Negative except as noted here or in the History of Present Illness.   PHYSICAL EXAMINATION  Initial Vital Signs Blood pressure (!) 134/103, pulse 65, temperature 98.5 F (36.9 C), temperature source Oral, resp. rate 16, SpO2 99 %.  Examination General: Well-developed, well-nourished patient in no acute distress; appearance consistent with age of record HENT: normocephalic; atraumatic Eyes: pupils equal, round and reactive to light; extraocular muscles intact Neck: supple Heart: regular rate and rhythm Lungs: clear to auscultation bilaterally Abdomen: soft; nondistended; left upper quadrant tenderness; no masses or hepatosplenomegaly; bowel sounds present GU: No CVA tenderness Extremities: No deformity; full range of motion; pulses normal Neurologic: Awake, alert and oriented; motor function intact in all extremities and symmetric; no facial droop Skin: Warm and dry Psychiatric: Normal mood and affect   RESULTS  Summary of this visit's results, reviewed and interpreted by myself:   EKG Interpretation  Date/Time:    Ventricular Rate:    PR Interval:    QRS Duration:   QT Interval:    QTC Calculation:   R Axis:     Text Interpretation:        Laboratory Studies: Results for orders placed or performed during the hospital encounter of 08/05/20 (from the past 24 hour(s))  Lipase, blood     Status: None   Collection Time: 08/05/20 12:08 AM  Result Value Ref  Range   Lipase 28 11 - 51 U/L  Comprehensive metabolic panel     Status: Abnormal   Collection Time: 08/05/20 12:08 AM  Result Value Ref Range   Sodium 136 135 - 145 mmol/L   Potassium 3.4 (L) 3.5 - 5.1 mmol/L   Chloride 97 (L) 98 - 111 mmol/L   CO2 24 22 - 32 mmol/L   Glucose, Bld 98 70 - 99 mg/dL   BUN 8 6 - 20 mg/dL   Creatinine, Ser 2.26 0.61 - 1.24 mg/dL   Calcium 33.3 8.9 - 54.5 mg/dL   Total Protein 9.1 (H) 6.5 - 8.1 g/dL   Albumin 5.4 (H) 3.5 - 5.0 g/dL   AST 16 15 - 41 U/L   ALT 16 0 - 44 U/L     Alkaline Phosphatase 57 38 - 126 U/L   Total Bilirubin 1.6 (H) 0.3 - 1.2 mg/dL   GFR, Estimated >62 >56 mL/min   Anion gap 15 5 - 15  CBC     Status: Abnormal   Collection Time: 08/05/20 12:08 AM  Result Value Ref Range   WBC 6.3 4.0 - 10.5 K/uL   RBC 5.46 4.22 - 5.81 MIL/uL   Hemoglobin 14.7 13.0 - 17.0 g/dL   HCT 38.9 39 - 52 %   MCV 74.9 (L) 80.0 - 100.0 fL   MCH 26.9 26.0 - 34.0 pg   MCHC 35.9 30.0 - 36.0 g/dL   RDW 37.3 42.8 - 76.8 %   Platelets 433 (H) 150 - 400 K/uL   nRBC 0.0 0.0 - 0.2 %  Urinalysis, Routine w reflex microscopic Urine, Clean Catch     Status: Abnormal   Collection Time: 08/05/20 12:08 AM  Result Value Ref Range   Color, Urine STRAW (A) YELLOW   APPearance CLEAR CLEAR   Specific Gravity, Urine 1.004 (L) 1.005 - 1.030   pH 6.0 5.0 - 8.0   Glucose, UA NEGATIVE NEGATIVE mg/dL   Hgb urine dipstick NEGATIVE NEGATIVE   Bilirubin Urine NEGATIVE NEGATIVE   Ketones, ur NEGATIVE NEGATIVE mg/dL   Protein, ur NEGATIVE NEGATIVE mg/dL   Nitrite NEGATIVE NEGATIVE   Leukocytes,Ua NEGATIVE NEGATIVE   Imaging Studies: No results found.  ED COURSE and MDM  Nursing notes, initial and subsequent vitals signs, including pulse oximetry, reviewed and interpreted by myself.  Vitals:   08/05/20 0000 08/05/20 0200  BP: (!) 134/103 128/66  Pulse: 65 88  Resp: 16 18  Temp: 98.5 F (36.9 C) 98.9 F (37.2 C)  TempSrc: Oral Oral  SpO2: 99% 99%   Medications  pantoprazole (PROTONIX) injection 40 mg (40 mg Intravenous Given 08/05/20 0115)  sucralfate (CARAFATE) 1 GM/10ML suspension 1 g (1 g Oral Given 08/05/20 0115)   3:01 AM Patient is pain-free after oral Carafate and IV Protonix.  I suspect gastritis and we will treat with appropriate medications.   PROCEDURES  Procedures   ED DIAGNOSES     ICD-10-CM   1. Left upper quadrant abdominal pain  R10.12        Salah Burlison, Jonny Ruiz, MD 08/05/20 1157

## 2020-08-05 NOTE — ED Triage Notes (Signed)
Pt reports LLQ abdominal pain. States that it comes and goes. Started earlier this week and states that today was really bad. States that the pain radiates to pt's back. Denies nausea, vomiting, or diarrhea. Would also like STD screening. Denies any symptoms.

## 2020-08-07 LAB — GC/CHLAMYDIA PROBE AMP (~~LOC~~) NOT AT ARMC
Chlamydia: NEGATIVE
Comment: NEGATIVE
Comment: NORMAL
Neisseria Gonorrhea: NEGATIVE

## 2020-08-13 ENCOUNTER — Other Ambulatory Visit: Payer: Self-pay

## 2020-08-13 ENCOUNTER — Emergency Department (HOSPITAL_COMMUNITY)
Admission: EM | Admit: 2020-08-13 | Discharge: 2020-08-13 | Disposition: A | Discharge status: Home / Self Care | Payer: Self-pay | Attending: Emergency Medicine | Admitting: Emergency Medicine

## 2020-08-13 ENCOUNTER — Encounter (HOSPITAL_COMMUNITY): Payer: Self-pay

## 2020-08-13 ENCOUNTER — Emergency Department (HOSPITAL_COMMUNITY): Payer: Self-pay

## 2020-08-13 DIAGNOSIS — Z79899 Other long term (current) drug therapy: Secondary | ICD-10-CM | POA: Insufficient documentation

## 2020-08-13 DIAGNOSIS — K859 Acute pancreatitis without necrosis or infection, unspecified: Secondary | ICD-10-CM | POA: Insufficient documentation

## 2020-08-13 LAB — COMPREHENSIVE METABOLIC PANEL
ALT: 16 U/L (ref 0–44)
AST: 16 U/L (ref 15–41)
Albumin: 4.3 g/dL (ref 3.5–5.0)
Alkaline Phosphatase: 45 U/L (ref 38–126)
Anion gap: 9 (ref 5–15)
BUN: 9 mg/dL (ref 6–20)
CO2: 23 mmol/L (ref 22–32)
Calcium: 9.1 mg/dL (ref 8.9–10.3)
Chloride: 101 mmol/L (ref 98–111)
Creatinine, Ser: 0.77 mg/dL (ref 0.61–1.24)
GFR, Estimated: >60 mL/min (ref >60)
Glucose, Bld: 135 mg/dL — ABNORMAL HIGH (ref 70–99)
Potassium: 3.4 mmol/L — ABNORMAL LOW (ref 3.5–5.1)
Sodium: 133 mmol/L — ABNORMAL LOW (ref 135–145)
Total Bilirubin: 0.9 mg/dL (ref 0.3–1.2)
Total Protein: 7.6 g/dL (ref 6.5–8.1)

## 2020-08-13 LAB — URINALYSIS, ROUTINE W REFLEX MICROSCOPIC
Bilirubin Urine: NEGATIVE
Glucose, UA: NEGATIVE mg/dL
Hgb urine dipstick: NEGATIVE
Ketones, ur: NEGATIVE mg/dL
Leukocytes,Ua: NEGATIVE
Nitrite: NEGATIVE
Protein, ur: NEGATIVE mg/dL
Specific Gravity, Urine: 1.013 (ref 1.005–1.030)
pH: 7 (ref 5.0–8.0)

## 2020-08-13 LAB — CBC
HCT: 37.5 % — ABNORMAL LOW (ref 39.0–52.0)
Hemoglobin: 13 g/dL (ref 13.0–17.0)
MCH: 26.4 pg (ref 26.0–34.0)
MCHC: 34.7 g/dL (ref 30.0–36.0)
MCV: 76.2 fL — ABNORMAL LOW (ref 80.0–100.0)
Platelets: 378 10*3/uL (ref 150–400)
RBC: 4.92 MIL/uL (ref 4.22–5.81)
RDW: 13.6 % (ref 11.5–15.5)
WBC: 5 10*3/uL (ref 4.0–10.5)
nRBC: 0 % (ref 0.0–0.2)

## 2020-08-13 LAB — LIPASE, BLOOD: Lipase: 171 U/L — ABNORMAL HIGH (ref 11–51)

## 2020-08-13 MED ORDER — HYDROCODONE-ACETAMINOPHEN 5-325 MG PO TABS
1.0000 | ORAL_TABLET | Freq: Four times a day (QID) | ORAL | 0 refills | Status: DC | PRN
Start: 2020-08-13 — End: 2022-04-21

## 2020-08-13 MED ORDER — ONDANSETRON 4 MG PO TBDP: 4.0000 mg | ORAL_TABLET | Freq: Three times a day (TID) | ORAL | 0 refills | Status: DC | PRN

## 2020-08-13 NOTE — ED Triage Notes (Signed)
Pt reports LLQ and mid abdominal pain. Pt denies N/V/D.

## 2020-08-13 NOTE — Discharge Instructions (Addendum)
For the next three days please try to limit your diet to clear liquids.  After that you may start following the pancreatitis eating plan.  If your pain worsens, you develop fevers, you are unable to eat due to uncontrolled nausea vomiting or have any other concerns please seek additional medical care and evaluation.  Please make sure you are avoiding alcohol as this can trigger pancreatitis.  I would recommend touching base with your endocrinologist to see if some of your medicines may put you at a higher risk for pancreatitis.

## 2020-08-13 NOTE — ED Provider Notes (Signed)
[facility] DEPT Provider Note   CSN: [id number] Arrival date & time: 08/13/20  1811     History Chief Complaint  Patient presents with  . Abdominal Pain    Noah Garcia is a 24 y.o. adult born male who identifies as male who presents today for evaluation of abdominal pain.  She reports that she has had intermittent abdominal pain since about 10 days ago.  The pain radiates into her back.  She denies any N/V/D.  She denies any alcohol use.  She reports that she has not had any history of gall bladder disease or prior abdominal surgeries.    She does take estrogen therapy. She denies any dysuria, frequency or urgency.  No penile or testicular pain.    HPI     History reviewed. No pertinent past medical history.  Patient Active Problem List   Diagnosis Date Noted  . 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 No pertinent surgical history.     Family History  Family history unknown: Yes    Social History   Tobacco Use  . Smoking status: Never Smoker  . Smokeless tobacco: Never Used  Substance Use Topics  . Alcohol use: Yes    Alcohol/week: 2.0 standard drinks    Types: 2 Standard drinks or equivalent per week  . Drug use: No    Home Medications Prior to Admission medications   Medication Sig Start Date End Date Taking? Authorizing Provider  estradiol (ESTRACE) 2 MG tablet Take 1 tablet (2 mg total) by mouth 3 (three) times daily. 09/09/18  Yes Shawnee Knapp, MD  lidocaine (LIDODERM) 5 % Place 1 patch onto the skin daily. Remove & Discard patch within 12 hours or as directed by MD 04/12/19  Yes Henderly, Britni A, PA-C  spironolactone (ALDACTONE) 100 MG tablet Take 1 tablet (100 mg total) by mouth 2 (two) times daily. 09/09/18  Yes Shawnee Knapp, MD  HYDROcodone-acetaminophen (NORCO/VICODIN) 5-325 MG tablet Take 1 tablet by mouth every 6 (six) hours as needed for severe pain. 08/13/20   Lorin Glass, PA-C    omeprazole (PRILOSEC) 20 MG capsule Take 1 tablet every morning at least 30 minutes before first dose of Carafate. Patient not taking: Reported on 08/13/2020 08/05/20   Molpus, Jenny Reichmann, MD  ondansetron (ZOFRAN ODT) 4 MG disintegrating tablet Take 1 tablet (4 mg total) by mouth every 8 (eight) hours as needed for nausea or vomiting. 08/13/20   Lorin Glass, PA-C  sucralfate (CARAFATE) 1 g tablet Take 1 tablet (1 g total) by mouth 4 (four) times daily -  with meals and at bedtime. Patient not taking: Reported on 08/13/2020 08/05/20   Molpus, Jenny Reichmann, MD    Allergies    Patient has no known allergies.  Review of Systems   Review of Systems  Constitutional: Negative for chills and fever.  Respiratory: Negative for cough and shortness of breath.   Cardiovascular: Negative for chest pain.  Gastrointestinal: Positive for abdominal pain.  Genitourinary: Negative for dysuria and urgency.  Musculoskeletal: Positive for back pain.  Neurological: Negative for weakness and headaches.  Psychiatric/Behavioral: Negative for confusion.  All other systems reviewed and are negative.   Physical Exam Updated Vital Signs BP 121/85   Pulse 67   Temp 98.1 F (36.7 C) (Oral)   Resp 18   Ht 5' 3"  (1.6 m)   Wt 47.2 kg   SpO2 99%   BMI 18.42 kg/m   Physical Exam Vitals  and nursing note reviewed.  Constitutional:      General: She is not in acute distress.    Appearance: She is well-developed. She is not diaphoretic.  HENT:     Head: Normocephalic and atraumatic.     Right Ear: External ear normal.     Left Ear: External ear normal.     Nose: Nose normal.  Eyes:     General: No scleral icterus.       Right eye: No discharge.        Left eye: No discharge.     Conjunctiva/sclera: Conjunctivae normal.     Pupils: Pupils are equal, round, and reactive to light.  Neck:     Trachea: No tracheal deviation.  Cardiovascular:     Rate and Rhythm: Normal rate and regular rhythm.     Heart sounds: No  murmur heard.  No friction rub. No gallop.   Pulmonary:     Effort: Pulmonary effort is normal. No respiratory distress.     Breath sounds: Normal breath sounds. No wheezing.  Abdominal:     General: Abdomen is flat. Bowel sounds are normal. There is no distension.     Palpations: Abdomen is soft.     Tenderness: There is abdominal tenderness in the right upper quadrant and epigastric area. There is no guarding or rebound.     Hernia: No hernia is present.  Musculoskeletal:        General: No deformity.     Cervical back: Normal range of motion and neck supple.  Lymphadenopathy:     Cervical: No cervical adenopathy.  Skin:    General: Skin is warm and dry.  Neurological:     General: No focal deficit present.     Mental Status: She is alert.     Cranial Nerves: No cranial nerve deficit.     Sensory: No sensory deficit.     Motor: No abnormal muscle tone.  Psychiatric:        Mood and Affect: Mood normal.        Behavior: Behavior normal.     ED Results / Procedures / Treatments   Labs (all labs ordered are listed, but only abnormal results are displayed) Labs Reviewed  LIPASE, BLOOD - Abnormal; Notable for the following components:      Result Value   Lipase 171 (*)    All other components within normal limits  COMPREHENSIVE METABOLIC PANEL - Abnormal; Notable for the following components:   Sodium 133 (*)    Potassium 3.4 (*)    Glucose, Bld 135 (*)    All other components within normal limits  CBC - Abnormal; Notable for the following components:   HCT 37.5 (*)    MCV 76.2 (*)    All other components within normal limits  URINALYSIS, ROUTINE W REFLEX MICROSCOPIC    EKG None  Radiology US Abdomen Limited RUQ (LIVER/GB)  Result Date: 08/13/2020 CLINICAL DATA:  Epigastric abdominal pain, pancreatitis EXAM: ULTRASOUND ABDOMEN LIMITED RIGHT UPPER QUADRANT COMPARISON:  None. FINDINGS: Gallbladder: No gallstones or wall thickening visualized. No sonographic Murphy  sign noted by sonographer. Common bile duct: Diameter: 2 mm in proximal diameter Liver: No focal lesion identified. Within normal limits in parenchymal echogenicity. Portal vein is patent on color Doppler imaging with normal direction of blood flow towards the liver. Other: The visualized body and proximal tail the pancreas demonstrates normal parenchymal echogenicity. The pancreatic duct is not dilated. No peripancreatic fluid collections are identified. No parenchymal calcifications  are clearly identified. IMPRESSION: Normal examination Electronically Signed   By: Fidela Salisbury MD   On: 08/13/2020 20:47    Procedures Procedures (including critical care time)  Medications Ordered in ED Medications - No data to display  ED Course  I have reviewed the triage vital signs and the nursing notes.  Pertinent labs & imaging results that were available during my care of the patient were reviewed by me and considered in my medical decision making (see chart for details).  Clinical Course as of Aug 14 2307  Tue Aug 13, 2020  1943 I attempted to see patient, they are currently using the bathroom.  We will check back.   [EH]    Clinical Course User Index [EH] Ollen Gross   MDM Rules/Calculators/A&P                          Patient is a 24 year old adult who presents today for evaluation of epigastric abdominal pain.  She was seen for the same thing about 9 days ago with a reassuring work-up and discharge.  Here today her lipase is slightly elevated, it was 28 however is now 171.  She does not have significant transaminitis and her alk phos is not elevated.  She is afebrile and generally well-appearing.  No leukocytosis.  At the time of my exam she is afebrile, not tachycardic or tachypneic.  She denies excess alcohol use.  Ultrasound of the right upper quadrant was obtained without evidence of gallstones or cholecystitis that would be contributing to her pancreatitis.  At this time it is  unclear cause.  She was able to tolerate p.o. challenge with water without difficulty.  [place] PMP is consulted, she is given prescription for short course of Vicodin and Zofran after we discussed risks and benefits.  Recommended clear liquid diet for 3 days, after that if her symptoms are controlled she can advance to pancreatitis/low-fat diet.  She does not currently have a PCP, given follow-up with wellness clinic.  She does have a endocrinologist and recommended she contact them to see if any of her medications will make her more likely to have pancreatitis.   We discussed admission vs attempting to treat as an outpatient. She wishes for attempting to try treating conservatively at home after we discussed risks and benefits.    Return precautions were discussed with patient who states their understanding.  At the time of discharge patient denied any unaddressed complaints or concerns.  Patient is agreeable for discharge home.  Note: Portions of this report may have been transcribed using voice recognition software. Every effort was made to ensure accuracy; however, inadvertent computerized transcription errors may be present  Final Clinical Impression(s) / ED Diagnoses Final diagnoses:  Pancreatitis    Rx / DC Orders ED Discharge Orders         Ordered    HYDROcodone-acetaminophen (NORCO/VICODIN) 5-325 MG tablet  Every 6 hours PRN        08/13/20 2119    ondansetron (ZOFRAN ODT) 4 MG disintegrating tablet  Every 8 hours PRN        08/13/20 2119           Lorin Glass, PA-C 08/13/20 2308    Lacretia Leigh, MD 08/15/20 1555

## 2021-03-05 ENCOUNTER — Encounter (HOSPITAL_COMMUNITY): Payer: Self-pay

## 2021-03-05 ENCOUNTER — Emergency Department (HOSPITAL_COMMUNITY)
Admission: EM | Admit: 2021-03-05 | Discharge: 2021-03-06 | Disposition: A | Discharge status: Home / Self Care | Payer: Medicaid Other | Attending: Emergency Medicine | Admitting: Emergency Medicine

## 2021-03-05 ENCOUNTER — Other Ambulatory Visit: Payer: Self-pay

## 2021-03-05 DIAGNOSIS — K2900 Acute gastritis without bleeding: Secondary | ICD-10-CM | POA: Insufficient documentation

## 2021-03-05 DIAGNOSIS — B354 Tinea corporis: Secondary | ICD-10-CM | POA: Insufficient documentation

## 2021-03-05 LAB — COMPREHENSIVE METABOLIC PANEL
ALT: 19 U/L (ref 0–44)
AST: 22 U/L (ref 15–41)
Albumin: 4.4 g/dL (ref 3.5–5.0)
Alkaline Phosphatase: 67 U/L (ref 38–126)
Anion gap: 18 — ABNORMAL HIGH (ref 5–15)
BUN: 7 mg/dL (ref 6–20)
CO2: 21 mmol/L — ABNORMAL LOW (ref 22–32)
Calcium: 9.2 mg/dL (ref 8.9–10.3)
Chloride: 91 mmol/L — ABNORMAL LOW (ref 98–111)
Creatinine, Ser: 0.86 mg/dL (ref 0.61–1.24)
GFR, Estimated: >60 mL/min (ref >60)
Glucose, Bld: 88 mg/dL (ref 70–99)
Potassium: 3.3 mmol/L — ABNORMAL LOW (ref 3.5–5.1)
Sodium: 130 mmol/L — ABNORMAL LOW (ref 135–145)
Total Bilirubin: 0.6 mg/dL (ref 0.3–1.2)
Total Protein: 8 g/dL (ref 6.5–8.1)

## 2021-03-05 LAB — URINALYSIS, ROUTINE W REFLEX MICROSCOPIC
Bilirubin Urine: NEGATIVE
Glucose, UA: NEGATIVE mg/dL
Hgb urine dipstick: NEGATIVE
Ketones, ur: 5 mg/dL — AB
Leukocytes,Ua: NEGATIVE
Nitrite: NEGATIVE
Protein, ur: NEGATIVE mg/dL
Specific Gravity, Urine: 1.006 (ref 1.005–1.030)
pH: 6 (ref 5.0–8.0)

## 2021-03-05 LAB — CBC WITH DIFFERENTIAL/PLATELET
Abs Immature Granulocytes: 0 10*3/uL (ref 0.00–0.07)
Basophils Absolute: 0.2 10*3/uL — ABNORMAL HIGH (ref 0.0–0.1)
Basophils Relative: 3 %
Eosinophils Absolute: 0 10*3/uL (ref 0.0–0.5)
Eosinophils Relative: 0 %
HCT: 47.4 % (ref 39.0–52.0)
Hemoglobin: 16.2 g/dL (ref 13.0–17.0)
Lymphocytes Relative: 32 %
Lymphs Abs: 2.2 10*3/uL (ref 0.7–4.0)
MCH: 25 pg — ABNORMAL LOW (ref 26.0–34.0)
MCHC: 34.2 g/dL (ref 30.0–36.0)
MCV: 73.1 fL — ABNORMAL LOW (ref 80.0–100.0)
Monocytes Absolute: 0.8 10*3/uL (ref 0.1–1.0)
Monocytes Relative: 11 %
Neutro Abs: 3.7 10*3/uL (ref 1.7–7.7)
Neutrophils Relative %: 54 %
Platelets: 344 10*3/uL (ref 150–400)
RBC: 6.48 MIL/uL — ABNORMAL HIGH (ref 4.22–5.81)
RDW: 13.6 % (ref 11.5–15.5)
WBC: 6.9 10*3/uL (ref 4.0–10.5)
nRBC: 0 % (ref 0.0–0.2)
nRBC: 0 /100 WBC

## 2021-03-05 LAB — I-STAT BETA HCG BLOOD, ED (MC, WL, AP ONLY): I-stat hCG, quantitative: <5 m[IU]/mL (ref <5)

## 2021-03-05 LAB — LIPASE, BLOOD: Lipase: 27 U/L (ref 11–51)

## 2021-03-05 MED ORDER — PANTOPRAZOLE SODIUM 40 MG IV SOLR
40.0000 mg | Freq: Once | INTRAVENOUS | Status: AC
  Administered 2021-03-06: 40 mg via INTRAVENOUS
  Filled 2021-03-05: qty 40

## 2021-03-05 MED ORDER — ALUM & MAG HYDROXIDE-SIMETH 200-200-20 MG/5ML PO SUSP
30.0000 mL | Freq: Once | ORAL | Status: AC
  Administered 2021-03-06: 30 mL via ORAL
  Filled 2021-03-05: qty 30

## 2021-03-05 MED ORDER — METOCLOPRAMIDE HCL 5 MG/ML IJ SOLN
10.0000 mg | Freq: Once | INTRAMUSCULAR | Status: AC
  Administered 2021-03-06: 10 mg via INTRAVENOUS
  Filled 2021-03-05: qty 2

## 2021-03-05 MED ORDER — NYSTATIN 100000 UNIT/GM EX CREA
TOPICAL_CREAM | Freq: Two times a day (BID) | CUTANEOUS | Status: DC
  Filled 2021-03-05: qty 15

## 2021-03-05 MED ORDER — LACTATED RINGERS IV BOLUS
1000.0000 mL | Freq: Once | INTRAVENOUS | Status: AC
  Administered 2021-03-06: 1000 mL via INTRAVENOUS

## 2021-03-05 NOTE — ED Triage Notes (Signed)
Abdominal pain x 4 days with nausea and vomiting. Reports of rash that started from the groin area spreading to abdomen, chest and arms x 2 months.   Denies any fever and chills.

## 2021-03-05 NOTE — ED Provider Notes (Signed)
Emergency Medicine Provider Triage Evaluation Note  GEO SLONE , a 25 y.o. adult  was evaluated in triage.  Pt complains of abd pain.  Review of Systems  Positive: abd pain, nausea, vomiting, unable to pass flatus, rash Negative: Fever, cp, sob, dysuria  Physical Exam  Ht 5\' 3"  (1.6 m)   Wt 47.2 kg   BMI 18.43 kg/m  Gen:   Awake, no distress   Resp:  Normal effort  MSK:   Moves extremities without difficulty  Other:  Multiple scattered ovoid rash noted to abdomen, back , and arm abd mildly tender to palpation without guarding   Medical Decision Making  Medically screening exam initiated at 8:43 PM.  Appropriate orders placed.  Yassen A Dunlow was informed that the remainder of the evaluation will be completed by another provider, this initial triage assessment does not replace that evaluation, and the importance of remaining in the ED until their evaluation is complete.  abd pain x 3-4 days with nausea and vomiting.  Also has a non itchy non painful rash for the past few days.     Gwenevere Abbot, PA-C 03/05/21 2045    2046, MD 03/05/21 [phone]

## 2021-03-06 MED ORDER — ALUM & MAG HYDROXIDE-SIMETH 200-200-20 MG/5ML PO SUSP: 30.0000 mL | Freq: Four times a day (QID) | ORAL | 0 refills | Status: DC | PRN

## 2021-03-06 MED ORDER — ONDANSETRON 4 MG PO TBDP: ORAL_TABLET | ORAL | 0 refills | Status: DC

## 2021-03-06 MED ORDER — OMEPRAZOLE 20 MG PO CPDR: 20.0000 mg | DELAYED_RELEASE_CAPSULE | Freq: Two times a day (BID) | ORAL | 0 refills | Status: DC

## 2021-03-06 NOTE — ED Provider Notes (Signed)
[facility] EMERGENCY DEPARTMENT Provider Note   CSN: [id number] Arrival date & time: 03/05/21  1936     History Chief Complaint  Patient presents with  . Abdominal Pain  . Rash    Noah Garcia is a 25 y.o. adult.   Abdominal Pain Pain location:  Generalized Pain quality: aching and cramping   Pain severity:  Mild Timing:  Constant Chronicity:  New Context: not alcohol use and not sick contacts   Relieved by:  None tried Worsened by:  Nothing Ineffective treatments:  None tried Associated symptoms: no anorexia   Risk factors: no alcohol abuse   Rash Associated symptoms: abdominal pain        History reviewed. No pertinent past medical history.  Patient Active Problem List   Diagnosis Date Noted  . 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 No pertinent surgical history.     Family History  Family history unknown: Yes    Social History   Tobacco Use  . Smoking status: Never Smoker  . Smokeless tobacco: Never Used  Substance Use Topics  . Alcohol use: Yes    Alcohol/week: 2.0 standard drinks    Types: 2 Standard drinks or equivalent per week  . Drug use: No    Home Medications Prior to Admission medications   Medication Sig Start Date End Date Taking? Authorizing Provider  alum & mag hydroxide-simeth (MYLANTA) 200-200-20 MG/5ML suspension Take 30 mLs by mouth every 6 (six) hours as needed for indigestion or heartburn. 03/06/21  Yes Mataya Kilduff, Barbara Cower, MD  omeprazole (PRILOSEC) 20 MG capsule Take 1 capsule (20 mg total) by mouth 2 (two) times daily before a meal for 10 days. 03/06/21 03/16/21 Yes Matisse Salais, Barbara Cower, MD  ondansetron (ZOFRAN ODT) 4 MG disintegrating tablet 4mg  ODT q4 hours prn nausea/vomit 03/06/21  Yes Ludger Bones, 05/06/21, MD  estradiol (ESTRACE) 2 MG tablet Take 1 tablet (2 mg total) by mouth 3 (three) times daily. 09/09/18   14/6/19, MD  HYDROcodone-acetaminophen (NORCO/VICODIN) 5-325 MG tablet Take 1 tablet  by mouth every 6 (six) hours as needed for severe pain. 08/13/20   13/9/21, PA-C  lidocaine (LIDODERM) 5 % Place 1 patch onto the skin daily. Remove & Discard patch within 12 hours or as directed by MD 04/12/19   Henderly, Britni A, PA-C  spironolactone (ALDACTONE) 100 MG tablet Take 1 tablet (100 mg total) by mouth 2 (two) times daily. 09/09/18   14/6/19, MD  sucralfate (CARAFATE) 1 g tablet Take 1 tablet (1 g total) by mouth 4 (four) times daily -  with meals and at bedtime. Patient not taking: Reported on 08/13/2020 08/05/20 03/06/21  Molpus, 05/06/21, MD    Allergies    Patient has no known allergies.  Review of Systems   Review of Systems  Gastrointestinal: Positive for abdominal pain. Negative for anorexia.  Skin: Positive for rash.  All other systems reviewed and are negative.   Physical Exam Updated Vital Signs BP 120/82   Pulse 62   Temp 99.6 F (37.6 C) (Oral)   Resp 18   Ht 5\' 3"  (1.6 m)   Wt 47.2 kg   SpO2 97%   BMI 18.43 kg/m   Physical Exam Vitals and nursing note reviewed.  Constitutional:      Appearance: She is well-developed.  HENT:     Head: Normocephalic and atraumatic.     Nose: No congestion or rhinorrhea.     Mouth/Throat:  Mouth: Mucous membranes are moist.     Pharynx: Oropharynx is clear.  Eyes:     Pupils: Pupils are equal, round, and reactive to light.  Cardiovascular:     Rate and Rhythm: Normal rate.  Pulmonary:     Effort: Pulmonary effort is normal. No respiratory distress.  Abdominal:     General: Abdomen is flat. There is no distension.  Musculoskeletal:        General: Normal range of motion.     Cervical back: Normal range of motion.  Skin:    General: Skin is warm and dry.     Coloration: Skin is not jaundiced or pale.     Findings: Rash (well circumscribed slightly raised edges of rash on abdomen) present.  Neurological:     General: No focal deficit present.     Mental Status: She is alert.     ED Results /  Procedures / Treatments   Labs (all labs ordered are listed, but only abnormal results are displayed) Labs Reviewed  CBC WITH DIFFERENTIAL/PLATELET - Abnormal; Notable for the following components:      Result Value   RBC 6.48 (*)    MCV 73.1 (*)    MCH 25.0 (*)    Basophils Absolute 0.2 (*)    All other components within normal limits  COMPREHENSIVE METABOLIC PANEL - Abnormal; Notable for the following components:   Sodium 130 (*)    Potassium 3.3 (*)    Chloride 91 (*)    CO2 21 (*)    Anion gap 18 (*)    All other components within normal limits  URINALYSIS, ROUTINE W REFLEX MICROSCOPIC - Abnormal; Notable for the following components:   Color, Urine STRAW (*)    Ketones, ur 5 (*)    All other components within normal limits  LIPASE, BLOOD  I-STAT BETA HCG BLOOD, ED ([facility], [facility], [facility] ONLY)    EKG None  Radiology No results found.  Procedures Procedures   Medications Ordered in ED Medications  nystatin cream (MYCOSTATIN) ( Topical Given 03/06/21 0038)  metoCLOPramide (REGLAN) injection 10 mg (10 mg Intravenous Given 03/06/21 0017)  lactated ringers bolus 1,000 mL (0 mLs Intravenous Stopped 03/06/21 0109)  alum & mag hydroxide-simeth (MAALOX/MYLANTA) 200-200-20 MG/5ML suspension 30 mL (30 mLs Oral Given 03/06/21 0017)  pantoprazole (PROTONIX) injection 40 mg (40 mg Intravenous Given 03/06/21 0016)    ED Course  I have reviewed the triage vital signs and the nursing notes.  Pertinent labs & imaging results that were available during my care of the patient were reviewed by me and considered in my medical decision making (see chart for details).    MDM Rules/Calculators/A&P                         Will work on symptomatc care. Suspect gastritis. Will treat rash and symptoms symptomatically. Feels better. Requests discharge prior to PO challenge.   Final Clinical Impression(s) / ED Diagnoses Final diagnoses:  Tinea corporis  Acute gastritis without hemorrhage, unspecified  gastritis type    Rx / DC Orders ED Discharge Orders         Ordered    alum & mag hydroxide-simeth (MYLANTA) 200-200-20 MG/5ML suspension  Every 6 hours PRN        03/06/21 0121    omeprazole (PRILOSEC) 20 MG capsule  2 times daily before meals        03/06/21 0121    ondansetron (ZOFRAN ODT) 4  MG disintegrating tablet        03/06/21 0122           Esmee Fallaw, Barbara Cower, MD 03/06/21 5176

## 2022-04-21 ENCOUNTER — Encounter (HOSPITAL_COMMUNITY): Payer: Self-pay | Admitting: Emergency Medicine

## 2022-04-21 ENCOUNTER — Ambulatory Visit (HOSPITAL_COMMUNITY)
Admission: EM | Admit: 2022-04-21 | Discharge: 2022-04-21 | Disposition: A | Discharge status: Home / Self Care | Payer: Medicaid Other | Attending: Family Medicine | Admitting: Family Medicine

## 2022-04-21 DIAGNOSIS — K2970 Gastritis, unspecified, without bleeding: Secondary | ICD-10-CM

## 2022-04-21 DIAGNOSIS — K2990 Gastroduodenitis, unspecified, without bleeding: Secondary | ICD-10-CM

## 2022-04-21 MED ORDER — OMEPRAZOLE 40 MG PO CPDR: 40.0000 mg | DELAYED_RELEASE_CAPSULE | Freq: Every day | ORAL | 0 refills | Status: AC

## 2022-04-21 NOTE — Discharge Instructions (Addendum)
Take omeprazole 40 mg--1 capsule daily for stomach acid.  Avoid spicy or acidic foods in the short-term  You can use the QR code/website at the back of the summary paperwork to schedule yourself a new patient appointment with primary care

## 2022-04-21 NOTE — ED Triage Notes (Addendum)
Pt reports upper mid abdominal pain associated with mid back back x 2.5 weeks. States the abdominal pain has been intermittent and as soon as the abdominal pain occurs, pt notices the back pain as well. States the abdominal pain sometimes goes away after eating then returns shortly after.  Has tried milk of magnesia and pepto with no relief.  Endorses nausea and states the last 3 days, it has been harder to have a BM without taking medication. States took a probiotic yesterday and was able to have a BM that was bright green.

## 2022-04-21 NOTE — ED Provider Notes (Signed)
[facility]    CSN: [id number] Arrival date & time: 04/21/22  1415      History   Chief Complaint Chief Complaint  Patient presents with   Abdominal Pain   Back Pain    HPI Noah Garcia is a 26 y.o. adult.    Abdominal Pain Back Pain Associated symptoms: abdominal pain    Here for epigastric abdominal pain that began 2 or 3 weeks ago.  It does sometimes have back pain associated with it.  She has had some nausea and decreased appetite but no vomiting.  She did get constipated but that has improved taking probiotics.  She has found that she is lactose intolerant, but she has not been taking in much dairy lately.  She did take in a bunch of acidic or spicy foods right around the time this all started.  No fever or chills  History reviewed. No pertinent past medical history.  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 No pertinent surgical history.     Home Medications    Prior to Admission medications   Medication Sig Start Date End Date Taking? Authorizing Provider  omeprazole (PRILOSEC) 40 MG capsule Take 1 capsule (40 mg total) by mouth daily. 04/21/22  Yes Zenia Resides, MD  alum & mag hydroxide-simeth [facility]) 200-200-20 MG/5ML suspension Take 30 mLs by mouth every 6 (six) hours as needed for indigestion or heartburn. 03/06/21   Mesner, Barbara Cower, MD  estradiol (ESTRACE) 2 MG tablet Take 1 tablet (2 mg total) by mouth 3 (three) times daily. 09/09/18   Sherren Mocha, MD  lidocaine (LIDODERM) 5 % Place 1 patch onto the skin daily. Remove & Discard patch within 12 hours or as directed by MD 04/12/19   Henderly, Britni A, PA-C  spironolactone (ALDACTONE) 100 MG tablet Take 1 tablet (100 mg total) by mouth 2 (two) times daily. 09/09/18   Sherren Mocha, MD  sucralfate (CARAFATE) 1 g tablet Take 1 tablet (1 g total) by mouth 4 (four) times daily -  with meals and at bedtime. Patient not  taking: Reported on 08/13/2020 08/05/20 03/06/21  Molpus, Jonny Ruiz, MD    Family History Family History  Family history unknown: Yes    Social History Social History   Tobacco Use   Smoking status: Never   Smokeless tobacco: Never  Substance Use Topics   Alcohol use: Yes    Alcohol/week: 2.0 standard drinks of alcohol    Types: 2 Standard drinks or equivalent per week   Drug use: No     Allergies   Patient has no known allergies.   Review of Systems Review of Systems  Gastrointestinal:  Positive for abdominal pain.  Musculoskeletal:  Positive for back pain.     Physical Exam Triage Vital Signs ED Triage Vitals  Enc Vitals Group     BP 04/21/22 1435 108/84     Pulse Rate 04/21/22 1435 70     Resp 04/21/22 1435 18     Temp 04/21/22 1435 99.6 F (37.6 C)     Temp Source 04/21/22 1435 Oral     SpO2 04/21/22 1435 96 %     Weight --      Height --      Head Circumference --      Peak Flow --      Pain Score 04/21/22 1432 7     Pain Loc --  Pain Edu? --      Excl. in GC? --    No data found.  Updated Vital Signs BP 108/84 (BP Location: Left Arm)   Pulse 70   Temp 99.6 F (37.6 C) (Oral)   Resp 18   SpO2 96%   Visual Acuity Right Eye Distance:   Left Eye Distance:   Bilateral Distance:    Right Eye Near:   Left Eye Near:    Bilateral Near:     Physical Exam Vitals reviewed.  Constitutional:      General: She is not in acute distress.    Appearance: She is not ill-appearing, toxic-appearing or diaphoretic.  HENT:     Mouth/Throat:     Mouth: Mucous membranes are moist.     Pharynx: No oropharyngeal exudate or posterior oropharyngeal erythema.  Eyes:     Extraocular Movements: Extraocular movements intact.     Pupils: Pupils are equal, round, and reactive to light.  Cardiovascular:     Rate and Rhythm: Normal rate and regular rhythm.     Heart sounds: No murmur heard. Pulmonary:     Effort: Pulmonary effort is normal.     Breath sounds:  Normal breath sounds.  Abdominal:     General: There is no distension.     Palpations: Abdomen is soft. There is no mass.     Tenderness: There is abdominal tenderness (epigastric). There is no guarding.  Musculoskeletal:     Cervical back: Neck supple.  Lymphadenopathy:     Cervical: No cervical adenopathy.  Skin:    Coloration: Skin is not jaundiced or pale.  Neurological:     Mental Status: She is alert and oriented to person, place, and time.  Psychiatric:        Behavior: Behavior normal.      UC Treatments / Results  Labs (all labs ordered are listed, but only abnormal results are displayed) Labs Reviewed - No data to display  EKG   Radiology No results found.  Procedures Procedures (including critical care time)  Medications Ordered in UC Medications - No data to display  Initial Impression / Assessment and Plan / UC Course  I have reviewed the triage vital signs and the nursing notes.  Pertinent labs & imaging results that were available during my care of the patient were reviewed by me and considered in my medical decision making (see chart for details).     Going to treat for possible gastritis.  She will try to establish with a primary care to have follow-up on this issue. Final Clinical Impressions(s) / UC Diagnoses   Final diagnoses:  Gastritis and gastroduodenitis     Discharge Instructions      Take omeprazole 40 mg--1 capsule daily for stomach acid.  Avoid spicy or acidic foods in the short-term  You can use the QR code/website at the back of the summary paperwork to schedule yourself a new patient appointment with primary care      ED Prescriptions     Medication Sig Dispense Auth. Provider   omeprazole (PRILOSEC) 40 MG capsule Take 1 capsule (40 mg total) by mouth daily. 30 capsule Zenia Resides, MD      PDMP not reviewed this encounter.   Zenia Resides, MD 04/21/22 [phone]

## 2022-12-18 ENCOUNTER — Encounter (HOSPITAL_COMMUNITY): Payer: Self-pay

## 2022-12-18 ENCOUNTER — Ambulatory Visit (INDEPENDENT_AMBULATORY_CARE_PROVIDER_SITE_OTHER): Payer: Medicaid Other | Admitting: Mental Health

## 2022-12-18 DIAGNOSIS — F321 Major depressive disorder, single episode, moderate: Secondary | ICD-10-CM | POA: Diagnosis not present
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
## 2023-10-21 ENCOUNTER — Encounter (HOSPITAL_COMMUNITY): Payer: Self-pay

## 2023-10-21 ENCOUNTER — Ambulatory Visit (HOSPITAL_COMMUNITY)
Admission: EM | Admit: 2023-10-21 | Discharge: 2023-10-21 | Disposition: A | Discharge status: Home / Self Care | Payer: Medicaid Other | Attending: Family Medicine | Admitting: Family Medicine

## 2023-10-21 DIAGNOSIS — Z202 Contact with and (suspected) exposure to infections with a predominantly sexual mode of transmission: Secondary | ICD-10-CM | POA: Diagnosis present

## 2023-10-21 LAB — HIV ANTIBODY (ROUTINE TESTING W REFLEX): HIV Screen 4th Generation wRfx: NONREACTIVE

## 2023-10-21 NOTE — Discharge Instructions (Signed)
You were seen today for STD exposure.  Since you are without symptoms we will hold off on treatment until results are done.  The swab and blood work will be resulted tomorrow and you will be notified if any treatment is needed.  Avoid intercourse until test results are complete.

## 2023-10-21 NOTE — ED Triage Notes (Signed)
Patient here today to be tested for STDs. Patient states that they were told by their partner that he tested positive for Chlamydia.

## 2023-10-21 NOTE — ED Provider Notes (Signed)
[facility]    CSN: [id number] Arrival date & time: 10/21/23  [id number]      History   Chief Complaint Chief Complaint  Patient presents with   SEXUALLY TRANSMITTED DISEASE    HPI Noah Garcia is a 28 y.o. adult.   She is here for STD exposure.  (Born male, identifies as male).  Her partner tested positive for chlamydia.  No symptoms at this time.  No discharge or pain.  She would like to do blood work as well today.        History reviewed. No pertinent past medical history.  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 No pertinent surgical history.     Home Medications    Prior to Admission medications   Medication Sig Start Date End Date Taking? Authorizing Provider  estradiol (ESTRACE) 2 MG tablet Take 1 tablet (2 mg total) by mouth 3 (three) times daily. 09/09/18   Sherren Mocha, MD  omeprazole (PRILOSEC) 40 MG capsule Take 1 capsule (40 mg total) by mouth daily. 04/21/22   Zenia Resides, MD  spironolactone (ALDACTONE) 100 MG tablet Take 1 tablet (100 mg total) by mouth 2 (two) times daily. 09/09/18   Sherren Mocha, MD  sucralfate (CARAFATE) 1 g tablet Take 1 tablet (1 g total) by mouth 4 (four) times daily -  with meals and at bedtime. Patient not taking: Reported on 08/13/2020 08/05/20 03/06/21  Molpus, Jonny Ruiz, MD    Family History Family History  Family history unknown: Yes    Social History Social History   Tobacco Use   Smoking status: Never   Smokeless tobacco: Never  Vaping Use   Vaping status: Never Used  Substance Use Topics   Alcohol use: Yes    Alcohol/week: 2.0 standard drinks of alcohol    Types: 2 Standard drinks or equivalent per week    Comment: socially   Drug use: No     Allergies   Patient has no known allergies.   Review of Systems Review of Systems  Constitutional: Negative.   HENT:  Negative.    Respiratory: Negative.    Cardiovascular: Negative.   Gastrointestinal: Negative.   Genitourinary: Negative.   Musculoskeletal: Negative.   Skin: Negative.      Physical Exam Triage Vital Signs ED Triage Vitals  Encounter Vitals Group     BP 10/21/23 1023 129/88     Systolic BP Percentile --      Diastolic BP Percentile --      Pulse Rate 10/21/23 1023 62     Resp 10/21/23 1023 16     Temp 10/21/23 1023 98.2 F (36.8 C)     Temp Source 10/21/23 1023 Oral     SpO2 10/21/23 1023 98 %     Weight 10/21/23 1024 103 lb (46.7 kg)     Height 10/21/23 1024 5\' 3"  (1.6 m)     Head Circumference --      Peak Flow --      Pain Score 10/21/23 1024 0     Pain Loc --      Pain Education --      Exclude from Growth Chart --    No data found.  Updated Vital Signs BP 129/88 (BP Location: Right Arm)   Pulse 62   Temp 98.2 F (36.8 C) (Oral)   Resp 16  Ht 5\' 3"  (1.6 m)   Wt 46.7 kg   SpO2 98%   BMI 18.25 kg/m   Visual Acuity Right Eye Distance:   Left Eye Distance:   Bilateral Distance:    Right Eye Near:   Left Eye Near:    Bilateral Near:     Physical Exam Constitutional:      Appearance: Normal appearance. She is normal weight.  Cardiovascular:     Rate and Rhythm: Normal rate and regular rhythm.  Pulmonary:     Effort: Pulmonary effort is normal.     Breath sounds: Normal breath sounds.  Musculoskeletal:     Cervical back: Normal range of motion.  Neurological:     General: No focal deficit present.     Mental Status: She is alert.  Psychiatric:        Mood and Affect: Mood normal.      UC Treatments / Results  Labs (all labs ordered are listed, but only abnormal results are displayed) Labs Reviewed  HIV ANTIBODY (ROUTINE TESTING W REFLEX)  RPR  CYTOLOGY, (ORAL, ANAL, URETHRAL) ANCILLARY ONLY    EKG   Radiology No results found.  Procedures Procedures (including critical care time)  Medications Ordered in UC Medications - No  data to display  Initial Impression / Assessment and Plan / UC Course  I have reviewed the triage vital signs and the nursing notes.  Pertinent labs & imaging results that were available during my care of the patient were reviewed by me and considered in my medical decision making (see chart for details).   Final Clinical Impressions(s) / UC Diagnoses   Final diagnoses:  STD exposure     Discharge Instructions      You were seen today for STD exposure.  Since you are without symptoms we will hold off on treatment until results are done.  The swab and blood work will be resulted tomorrow and you will be notified if any treatment is needed.  Avoid intercourse until test results are complete.     ED Prescriptions   None    PDMP not reviewed this encounter.   Jannifer Franklin, MD 10/21/23 1040

## 2023-10-22 LAB — CYTOLOGY, (ORAL, ANAL, URETHRAL) ANCILLARY ONLY
Chlamydia: NEGATIVE
Comment: NEGATIVE
Comment: NEGATIVE
Comment: NORMAL
Neisseria Gonorrhea: NEGATIVE
Trichomonas: NEGATIVE

## 2023-10-22 LAB — RPR: RPR Ser Ql: NONREACTIVE
